# Patient Record
Sex: Male | Born: 1970 | Hispanic: No | Marital: Single | Smoking: Former smoker
Health system: Southern US, Community
[De-identification: ages and names within clinical notes are randomized; demographics above are authoritative.]

## PROBLEM LIST (undated history)

## (undated) DIAGNOSIS — F845 Asperger's syndrome: Secondary | ICD-10-CM

## (undated) DIAGNOSIS — F1010 Alcohol abuse, uncomplicated: Secondary | ICD-10-CM

## (undated) DIAGNOSIS — F419 Anxiety disorder, unspecified: Secondary | ICD-10-CM

## (undated) HISTORY — DX: Alcohol abuse, uncomplicated: F10.10

---

## 1998-01-15 ENCOUNTER — Encounter: Admission: RE | Admit: 1998-01-15 | Discharge: 1998-01-15 | Payer: Self-pay | Admitting: Family Medicine

## 1998-02-10 ENCOUNTER — Encounter: Admission: RE | Admit: 1998-02-10 | Discharge: 1998-02-10 | Payer: Self-pay | Admitting: Family Medicine

## 1999-04-14 ENCOUNTER — Encounter: Admission: RE | Admit: 1999-04-14 | Discharge: 1999-04-14 | Payer: Self-pay | Admitting: Family Medicine

## 2002-05-25 ENCOUNTER — Emergency Department (HOSPITAL_COMMUNITY): Admission: EM | Admit: 2002-05-25 | Discharge: 2002-05-26 | Payer: Self-pay | Admitting: Emergency Medicine

## 2002-05-31 ENCOUNTER — Emergency Department (HOSPITAL_COMMUNITY): Admission: EM | Admit: 2002-05-31 | Discharge: 2002-05-31 | Payer: Self-pay | Admitting: Emergency Medicine

## 2013-02-16 ENCOUNTER — Encounter (HOSPITAL_COMMUNITY): Payer: Self-pay | Admitting: Emergency Medicine

## 2013-02-16 ENCOUNTER — Emergency Department (HOSPITAL_COMMUNITY)
Admission: EM | Admit: 2013-02-16 | Discharge: 2013-02-17 | Disposition: A | Discharge status: Other Acute Inpatient Hospital | Payer: BC Managed Care – PPO | Attending: Emergency Medicine | Admitting: Emergency Medicine

## 2013-02-16 DIAGNOSIS — F1410 Cocaine abuse, uncomplicated: Secondary | ICD-10-CM | POA: Insufficient documentation

## 2013-02-16 DIAGNOSIS — Z87891 Personal history of nicotine dependence: Secondary | ICD-10-CM | POA: Insufficient documentation

## 2013-02-16 DIAGNOSIS — Z79899 Other long term (current) drug therapy: Secondary | ICD-10-CM | POA: Insufficient documentation

## 2013-02-16 DIAGNOSIS — Z8659 Personal history of other mental and behavioral disorders: Secondary | ICD-10-CM | POA: Insufficient documentation

## 2013-02-16 DIAGNOSIS — F1010 Alcohol abuse, uncomplicated: Secondary | ICD-10-CM | POA: Insufficient documentation

## 2013-02-16 HISTORY — DX: Asperger's syndrome: F84.5

## 2013-02-16 LAB — CBC
HCT: 45.3 % (ref 39.0–52.0)
MCV: 96.6 fL (ref 78.0–100.0)
Platelets: 233 10*3/uL (ref 150–400)
RBC: 4.69 MIL/uL (ref 4.22–5.81)
WBC: 7.6 10*3/uL (ref 4.0–10.5)

## 2013-02-16 LAB — RAPID URINE DRUG SCREEN, HOSP PERFORMED
Amphetamines: NOT DETECTED
Barbiturates: NOT DETECTED
Tetrahydrocannabinol: NOT DETECTED

## 2013-02-16 LAB — COMPREHENSIVE METABOLIC PANEL
ALT: 48 U/L (ref 0–53)
AST: 50 U/L — ABNORMAL HIGH (ref 0–37)
Alkaline Phosphatase: 64 U/L (ref 39–117)
BUN: 11 mg/dL (ref 6–23)
CO2: 21 mEq/L (ref 19–32)
Chloride: 101 mEq/L (ref 96–112)
Creatinine, Ser: 0.79 mg/dL (ref 0.50–1.35)
GFR calc Af Amer: >90 mL/min (ref >90)
GFR calc non Af Amer: >90 mL/min (ref >90)
Total Bilirubin: 0.4 mg/dL (ref 0.3–1.2)

## 2013-02-16 LAB — SALICYLATE LEVEL: Salicylate Lvl: <2 mg/dL — ABNORMAL LOW (ref 2.8–20.0)

## 2013-02-16 MED ORDER — ZOLPIDEM TARTRATE 10 MG PO TABS: 10.0000 mg | ORAL_TABLET | Freq: Every evening | ORAL | Status: DC | PRN

## 2013-02-16 MED ORDER — THIAMINE HCL 100 MG/ML IJ SOLN: 100.0000 mg | Freq: Every day | INTRAMUSCULAR | Status: DC

## 2013-02-16 MED ORDER — VITAMIN B-1 100 MG PO TABS
100.0000 mg | ORAL_TABLET | Freq: Every day | ORAL | Status: DC
  Administered 2013-02-16 – 2013-02-17 (×2): 100 mg via ORAL
  Filled 2013-02-16 (×2): qty 1

## 2013-02-16 MED ORDER — NICOTINE 21 MG/24HR TD PT24
21.0000 mg | MEDICATED_PATCH | Freq: Once | TRANSDERMAL | Status: AC
  Administered 2013-02-16: 21 mg via TRANSDERMAL
  Filled 2013-02-16: qty 1

## 2013-02-16 MED ORDER — ALUM & MAG HYDROXIDE-SIMETH 200-200-20 MG/5ML PO SUSP: 30.0000 mL | ORAL | Status: DC | PRN

## 2013-02-16 MED ORDER — IBUPROFEN 200 MG PO TABS: 600.0000 mg | ORAL_TABLET | Freq: Three times a day (TID) | ORAL | Status: DC | PRN

## 2013-02-16 MED ORDER — DOUBLE ANTIBIOTIC 500-10000 UNIT/GM EX OINT
TOPICAL_OINTMENT | Freq: Two times a day (BID) | CUTANEOUS | Status: DC
  Administered 2013-02-16 (×2): 1 via TOPICAL
  Administered 2013-02-17: 10:00:00 via TOPICAL
  Filled 2013-02-16: qty 1

## 2013-02-16 MED ORDER — ACETAMINOPHEN 325 MG PO TABS: 650.0000 mg | ORAL_TABLET | ORAL | Status: DC | PRN

## 2013-02-16 MED ORDER — LORAZEPAM 1 MG PO TABS: 0.0000 mg | ORAL_TABLET | Freq: Two times a day (BID) | ORAL | Status: DC

## 2013-02-16 MED ORDER — DOUBLE ANTIBIOTIC 500-10000 UNIT/GM EX OINT
TOPICAL_OINTMENT | Freq: Two times a day (BID) | CUTANEOUS | Status: DC
  Filled 2013-02-16 (×18): qty 1

## 2013-02-16 MED ORDER — ONDANSETRON HCL 4 MG PO TABS: 4.0000 mg | ORAL_TABLET | Freq: Three times a day (TID) | ORAL | Status: DC | PRN

## 2013-02-16 MED ORDER — LORAZEPAM 1 MG PO TABS
0.0000 mg | ORAL_TABLET | Freq: Four times a day (QID) | ORAL | Status: DC
  Administered 2013-02-16: 1 mg via ORAL
  Filled 2013-02-16: qty 1

## 2013-02-16 NOTE — ED Provider Notes (Signed)
CSN: [id number]     Arrival date & time 02/16/13  4098 History   First MD Initiated Contact with Patient 02/16/13 [phone]     Chief Complaint  Patient presents with  . Medical Clearance   (Consider location/radiation/quality/duration/timing/severity/associated sxs/prior Treatment) HPI Patient presents to the emergency department for detox. His care was delayed because it took him a while to decide if he was wanting to sign the consent for treatment form and then to get undressed. He reports he wants detox from alcohol and cocaine. Patient reports he has Asperger's syndrome. He reports he has been seeing a psychiatrist for many years and has been on Xanax 6 mg a day (2 mg 3 times a day) for many years, over 20. He states he has been on Wellbutrin and Prozac in the past but they did not help. He states he stopped seeing his psychiatrist about 6 or 7 months ago and now has a counselor that he sees 7 days a week for one hour to help him get through his issues. Patient reports he has been having some personal problems mainly including some embezzelment in his company. Reports he has not been sleeping trying to gather evidence of the embezzlement and indicates something happened and he cannot prosecute the person involved, although he knows who did it and has proof.  He also reports he has custody of his 35 year old daughter, who is autistic, after his wife accused him of possibly molesting her. He also states his ex-wife is an addict, also. He reports he has been taping himself and writing in [employer] and "now sees what other people were seeing about me". He does not elucidate what that means. He states he drinks most days, he does admit to a pint of tequila a day. He also states he has been doing cocaine for the past 1-1/2 years off and on but it has gotten worse in the past 6 months when his psychiatrist was trying to change him off his Xanax. He reports they tried other medications, however he wanted to go  back to the Xanax however his psychiatrist would not putting back on the prior doses he been on in the past. He states he has been off the Xanax for at least 3-4 weeks. Patient quickly denies that he is depressed. He denies suicidal or homicidal ideation. He states he's never been to detox or rehabilitation in the past.  Patient noted to have a scaly area in the palm of his left hand with some cracking. He states that is from handling a chemical called MEK. Patient denies any acute physical problem such as sore throat, rhinorrhea, cough, vomiting or diarrhea.   PCP none Psychiatrist was Dr Donell Beers until 6-7 months ago  Past Medical History  Diagnosis Date  . Asperger's disorder    History reviewed. No pertinent past surgical history. No family history on file. History  Substance Use Topics  . Smoking status: Former Smoker    Types: Cigarettes  . Smokeless tobacco: Former Neurosurgeon    Types: Chew  . 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    Allergies  Review of patient's allergies indicates no known allergies.  Home Medications   Current Outpatient Rx  Name  Route  Sig  Dispense  Refill  . ALPRAZolam (XANAX) 1 MG tablet   Oral   Take 1-2 mg by mouth 3 (three) times daily as needed for anxiety.           Pt ran out 3-4 weeks ago   BP 155/97  Pulse 98  Temp(Src) 97.9 F (36.6 C) (Oral)  Resp 14  Ht 5\' 6"  (1.676 m)  Wt 145 lb (65.772 kg)  BMI 23.41 kg/m2  SpO2 99%  Vital signs normal   Physical Exam  Nursing note and vitals reviewed. Constitutional: He is oriented to person, place, and time. He appears well-developed and well-nourished.  Non-toxic appearance. He does not appear ill. No distress.  HENT:  Head: Normocephalic and  atraumatic.  Right Ear: External ear normal.  Left Ear: External ear normal.  Nose: Nose normal. No mucosal edema or rhinorrhea.  Mouth/Throat: Oropharynx is clear and moist and mucous membranes are normal. No dental abscesses or uvula swelling.  Eyes: Conjunctivae and EOM are normal. Pupils are equal, round, and reactive to light.  Neck: Normal range of motion and full passive range of motion without pain. Neck supple.  Cardiovascular: Normal rate, regular rhythm and normal heart sounds.  Exam reveals no gallop and no friction rub.   No murmur heard. Pulmonary/Chest: Effort normal and breath sounds normal. No respiratory distress. He has no wheezes. He has no rhonchi. He has no rales. He exhibits no tenderness and no crepitus.  Abdominal: Soft. Normal appearance and bowel sounds are normal. He exhibits no distension. There is no tenderness. There is no rebound and no guarding.  Musculoskeletal: Normal range of motion. He exhibits no edema and no tenderness.  Moves all extremities well.   Neurological: He is alert and oriented to person, place, and time. He has normal strength. No cranial nerve deficit.  Skin: Skin is warm, dry and intact. No rash noted. No erythema. No pallor.  Psychiatric: His speech is normal. His mood appears not anxious.  Pt hard to direct in questioning. Does not answer simple yes or no questions, talks about other things.    ED Course  Procedures (including critical care time)  Patient asking me at what point he could leave. He was advised that he could leave at any time however if he returned he would have to start the process all over. Pt states he has claustrophobia and it bothers him if he thinks he can't leave.   11:01 [facility] called to get information about referral  Pt placed in a hallway bed so he wouldn't have claustrophobia.   Labs Review Results for orders placed during the hospital encounter of 02/16/13  ACETAMINOPHEN LEVEL      Result Value Range    Acetaminophen (Tylenol), Serum <15.0  10 - 30 ug/mL  CBC      Result Value Range   WBC 7.6  4.0 - 10.5 K/uL   RBC 4.69  4.22 - 5.81 MIL/uL   Hemoglobin 16.2  13.0 - 17.0 g/dL   HCT 45.4  09.8 - 11.9 %   MCV 96.6  78.0 - 100.0 fL   MCH 34.5 (*) 26.0 - 34.0 pg   MCHC 35.8  30.0 - 36.0 g/dL   RDW 14.7  82.9 - 56.2 %   Platelets 233  150 - 400 K/uL  COMPREHENSIVE METABOLIC PANEL      Result Value Range   Sodium 137  135 - 145 mEq/L   Potassium 3.4 (*) 3.5 -  5.1 mEq/L   Chloride 101  96 - 112 mEq/L   CO2 21  19 - 32 mEq/L   Glucose, Bld 98  70 - 99 mg/dL   BUN 11  6 - 23 mg/dL   Creatinine, Ser 3.08  0.50 - 1.35 mg/dL   Calcium 9.3  8.4 - 65.7 mg/dL   Total Protein 7.9  6.0 - 8.3 g/dL   Albumin 4.4  3.5 - 5.2 g/dL   AST 50 (*) 0 - 37 U/L   ALT 48  0 - 53 U/L   Alkaline Phosphatase 64  39 - 117 U/L   Total Bilirubin 0.4  0.3 - 1.2 mg/dL   GFR calc non Af Amer >90  >90 mL/min   GFR calc Af Amer >90  >90 mL/min  ETHANOL      Result Value Range   Alcohol, Ethyl (B) 237 (*) 0 - 11 mg/dL  SALICYLATE LEVEL      Result Value Range   Salicylate Lvl <2.0 (*) 2.8 - 20.0 mg/dL   Laboratory interpretation all normal except mild hypokalemia, alcohol intoxication    Imaging Review No results found.  EKG Interpretation   None       MDM   1. Alcohol abuse   2. Cocaine abuse     Disposition pending   Devoria Albe, MD, Armando Gang    Ward Givens, MD 02/16/13 [phone]

## 2013-02-16 NOTE — ED Notes (Signed)
Pt talking on the phone with his wife.

## 2013-02-16 NOTE — ED Notes (Signed)
PER telephone conversation with pt EX wife, ex wife is concerned about pt's alcohol and drug abuse.  Reports that pt drinks 4 pints of Brandy daily and is 'smoking cocaine'.  Ex wife is concerned that pt will try to leave.  Ex wife also recommended that pt be treated with librium and reports that pt is not abusing xanax.  Xanax is needed for treatment.  Friends and family are concerned that pt may entering early stages of dementia.  Ex wife contact information [phone]).

## 2013-02-16 NOTE — ED Notes (Signed)
RN Tora Duck, began calling report to writer on this pt. She said that pt had red, dryness and sores on his arms and that's why bacitracin was ordered for him. There's also notes about putting pt bed in the hall because he was clausterphobic in the room and that the ex wife thinks he's got early stages of dementia. Asked lillibeth if writer could call her back after speaking to [facility]. Called [facility] and voiced all concerns and she looked over information and said we could take the pt. Charge RN, Scientist, research (medical) called for Clinical research associate while she was on the phone with the [facility] and she's the one that answered when writer tried to call lillibeth back to say pt could come back to room 43. She asked what concerns did Clinical research associate have about pt and Clinical research associate asked had she seen the pt herself at all and the concern was the statement that kind of sounded like the pt had bedbugs by the description of red, dry, scaly areas with sores. When Lillibeth called back the second time she said his arms are just red. Miss Victorino Dike said she would peek in on pt and writer told her pt could go to room 43. Writer thanked her also.

## 2013-02-16 NOTE — ED Notes (Signed)
Pt having tele-psych consult at this time.

## 2013-02-16 NOTE — ED Notes (Signed)
Pt states can not void at this time 

## 2013-02-16 NOTE — BH Assessment (Signed)
Assessment Note  Nathan Frey is an 42 y.o. male dx's with Asperger's. Patient presents to the emergency department for detox. He reports he wants detox from alcohol and cocaine. Patient started drinking at age 69. He does admit to drinking a pint of liqour a day for the past 5-6 yrs. His last drink was 0730 today.  He also states he tried cocaine at age 20. His cocaine usage has grown more consistent over the past 5-6 yrs and worsened in the past yr. Patient reports spending $1000 per week on cocaine in the last 1- 1/2 year.  He also states that over the past 1-1/2 years his cocaine usage increased b/c his psychiatrist-[name] was trying to take him off his Xanax. Patient reports no withdrawal symptoms. He has no history of black outs or seizures.  He reports that he has been seeing a psychiatrist-[name] for many years and has been presceibed Xanax 6 mg a day (2 mg 3 times a day) for many years (over 20 + yrs). He reports they tried other medications such as Wellbutrin and Prozac. Sts they did not help and he wanted to go back to the Xanax. His psychiatrist would not put him back on the prior doses he been on in the past. He states he has been off the Xanax for at least 3-4 weeks. He states he stopped seeing his psychiatrist several months ago and now has a counselor that he sees 7 days a week for one hour to help him get through his issues.  Patient denies SI, HI, and AVH's. He no associated history of symptoms. He does report feelings of increased anxiety and depression. He feels hopeless and is very tearful during the assessment. 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 His business is suffering b/c of someone within the company . Reports he has not been sleeping trying to gather evidence of the embezzlement and indicates something happened and he cannot prosecute the person involved, although he knows who did it  and has proof. He also reports he has custody of his 8 year old daughter, who is autistic, after his wife accused him of possibly molesting her.   Patient has no history of inpatient/outpatient mental health treatment. He also has no history of substance abuse treatment.  Patient is pending a evaluation by an extender at [facility]. Writer has contacted [facility] and although they do not have any beds available will take a faxed referral, per [name]  [phone] if psychiatry clears patient for detox treatment. [name] sts they are willing to review and possible place on their waitlist but must also see the psych consult.    Axis I: Alcohol Dependence; Cocaine Abuse; Depressive Disorder NOS, and Anxiety Disorder NOS Axis II: Deferred Axis III:  Past Medical History  Diagnosis Date  . Asperger's disorder    Axis IV: other psychosocial or environmental problems, problems related to social environment, problems with access to health care services and problems with primary support group Axis V: 41-50 serious symptoms  Past Medical History:  Past Medical History  Diagnosis Date  . Asperger's disorder     History reviewed. No pertinent past surgical history.  Family History: No family history on file.  Social History:  reports that he has quit smoking. His smoking use included Cigarettes. He smoked 0.00 packs per day. He has quit using smokeless tobacco. His smokeless tobacco use included Chew. He reports that he  drinks alcohol. He reports that he uses illicit drugs (Cocaine).  Additional Social History:  Alcohol / Drug Use Pain Medications: SEE MAR Prescriptions: SEE MAR Over the Counter: SEE MAR History of alcohol / drug use?: Yes Substance #1 Name of Substance 1: Alcohol  1 - Age of First Use: 15 yrs ago  1 - Amount (size/oz): "I drink all day"; "Sometimes I drink up to 1 pint" 1 - Frequency: daily for the past 4-5 yrs  1 - Duration: on-going  1 - Last Use / Amount: today   Substance #2 Name of Substance 2: Cocaine  2 - Age of First Use: 13 yrs ago 2 - Amount (size/oz): $1000 per week 2 - Frequency: daily  2 - Duration: on-going  2 - Last Use / Amount: today   CIWA: CIWA-Ar BP: 136/82 mmHg Pulse Rate: 95 Nausea and Vomiting: no nausea and no vomiting Tactile Disturbances: none Tremor: no tremor Auditory Disturbances: not present Paroxysmal Sweats: no sweat visible Visual Disturbances: not present Anxiety: five Headache, Fullness in Head: none present Agitation: moderately fidgety and restless Orientation and Clouding of Sensorium: oriented and can do serial additions CIWA-Ar Total: 9 COWS:    Allergies: No Known Allergies  Home Medications:  (Not in a hospital admission)  OB/GYN Status:  No LMP for male patient.  General Assessment Data Location of Assessment: [facility] Is this a Tele or Face-to-Face Assessment?: Tele Assessment Living Arrangements: Other (Comment) (lives in home with ex-spouse and 89 y/o daughter) Can pt return to current living arrangement?: Yes Admission Status: Voluntary Is patient capable of signing voluntary admission?: Yes Transfer from: Acute Hospital Referral Source: Self/Family/Friend     [facility] Crisis Care Plan Living Arrangements: Other (Comment) (lives in home with ex-spouse and 14 y/o daughter) Name of Psychiatrist:  (Dr. [name]) Name of Therapist:  (None Reported)  Education Status Is patient currently in school?: No  Risk to self Suicidal Ideation: No Suicidal Intent: No Is patient at risk for suicide?: No Suicidal Plan?: No Access to Means: No What has been your use of drugs/alcohol within the last 12 months?:  (patient reports alcohol and drug use) Previous Attempts/Gestures: No How many times?:  (0) Other Self Harm Risks:  (n/a) Triggers for Past Attempts:  (no previous attempts/gestures) Intentional Self Injurious Behavior: None Family Suicide History: No Recent stressful life event(s):  Other (Comment) ( custody of his 65 y/o daughter,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) Persecutory voices/beliefs?: No Depression: Yes Depression Symptoms: Tearfulness;Feeling angry/irritable Substance abuse history and/or treatment for substance abuse?: No Suicide prevention information given to non-admitted patients: Not applicable  Risk to Others Homicidal Ideation: No Thoughts of Harm to Others: No Current Homicidal Intent: No Current Homicidal Plan: No Access to Homicidal Means: No Identified Victim:  (n/a) History of harm to others?: No Assessment of Violence: None Noted Violent Behavior Description:  (patient is calm and cooperative ) Does patient have access to weapons?: No Criminal Charges Pending?: No Does patient have a court date: No  Psychosis Hallucinations: None noted Delusions: None noted  Mental Status Report Appear/Hygiene: Disheveled Eye Contact: Fair Motor Activity: Freedom of movement Speech: Logical/coherent Level of Consciousness: Alert Mood: Depressed Affect: Appropriate to circumstance Anxiety Level: None Thought Processes: Relevant;Circumstantial;Flight of Ideas Judgement: Impaired Orientation: Person;Place;Time;Situation Obsessive Compulsive Thoughts/Behaviors: None  Cognitive Functioning Memory: Recent Intact;Remote Intact IQ: Average Insight: Fair Impulse Control: Fair Appetite: Fair Weight Loss:  (none reported) Weight Gain:  (none reported) Sleep: Decreased Total Hours of Sleep:  ("I haven't slept in  days") Vegetative Symptoms: None  ADLScreening [facility] Assessment Services) Patient's cognitive ability adequate to safely complete daily activities?: Yes Patient able to express need for assistance with ADLs?: Yes Independently performs ADLs?: Yes (appropriate for developmental age)  Prior Inpatient Therapy Prior Inpatient Therapy: No Prior Therapy Dates:  (n/a) Prior Therapy Facilty/Provider(s):  (n/a) Reason for Treatment:  (n/a)  Prior  Outpatient Therapy Prior Outpatient Therapy: Yes Prior Therapy Dates:  (past-Dr.Plotvosky; past-Lisa McClain in [place]) Prior Therapy Facilty/Provider(s):  (Dr. Charlette Caffey and Jerl Santos) Reason for Treatment:  (n/a)  ADL Screening (condition at time of admission) Patient's cognitive ability adequate to safely complete daily activities?: Yes Is the patient deaf or have difficulty hearing?: No Does the patient have difficulty seeing, even when wearing glasses/contacts?: No Does the patient have difficulty concentrating, remembering, or making decisions?: No Patient able to express need for assistance with ADLs?: Yes Does the patient have difficulty dressing or bathing?: No Independently performs ADLs?: Yes (appropriate for developmental age) Communication: Independent Dressing (OT): Independent Grooming: Independent Feeding: Independent Bathing: Independent Toileting: Independent In/Out Bed: Independent Walks in Home: Independent Does the patient have difficulty walking or climbing stairs?: No Weakness of Legs: None Weakness of Arms/Hands: None  Home Assistive Devices/Equipment Home Assistive Devices/Equipment: None    Abuse/Neglect Assessment (Assessment to be complete while patient is alone) Physical Abuse: Denies Verbal Abuse: Denies Sexual Abuse: Denies Exploitation of patient/patient's resources: Denies Self-Neglect: Denies Values / Beliefs Cultural Requests During Hospitalization: None Spiritual Requests During Hospitalization: None   Advance Directives (For Healthcare) Advance Directive: Patient does not have advance directive Nutrition Screen- [facility] Adult/[facility] Patient's home diet: Regular  Additional Information 1:1 In Past 12 Months?: No CIRT Risk: No Elopement Risk: No Does patient have medical clearance?: Yes     Disposition:  Disposition Initial Assessment Completed for this Encounter: Yes Disposition of Patient: Other dispositions (Pending  evaluation by an extender for recommendations) Other disposition(s): Other (Comment) (pt requesting detox; further dispo. pending eval by extender)  On Site Evaluation by:   Reviewed with Physician:    Melynda Ripple [facility] 02/16/2013 1:40 PM

## 2013-02-16 NOTE — ED Notes (Signed)
Pt awaiting bed in secure holding area.

## 2013-02-16 NOTE — ED Notes (Signed)
Informed pt that we need urine specimen. Pt states he is unable to go at this time. Gave pt cup ice water.

## 2013-02-16 NOTE — ED Notes (Signed)
Pt has 1 personal belongings bag 1 blue duffle bag and 1 black back pack brown shoes socks jeans belt shirt 1 key ring red folder black folder 1 pair jeans 5 shirts boxers shampoo razor shaving cream toothpaste

## 2013-02-16 NOTE — ED Notes (Signed)
Belongings placed in locker 27 

## 2013-02-16 NOTE — ED Notes (Signed)
Pt been taking xanax 6mg  everyday for 20 years and been out for couple weeks now. Pt been abusing alcohol and cocaine.

## 2013-02-16 NOTE — ED Notes (Signed)
Pt requesting detox from alcohol and cocaine. Pt last drink was about 15 mins ago.

## 2013-02-16 NOTE — ED Notes (Signed)
Personal belongings moved to [address].

## 2013-02-16 NOTE — BH Assessment (Signed)
Writer contacted EDP-Dr. Lynelle Doctor, [place] prior to assessing this patient. TA scheduled for 11am. Writer contacted [facility] staff-Asja and asked that the telepsych machine is placed in the room.

## 2013-02-16 NOTE — Consult Note (Signed)
[facility] Face-to-Face Psychiatry Consult   Reason for Consult:  Alcohol dependence, Cocaine dependence Referring Physician:  EDP Nathan Frey is an 42 y.o. male.  Assessment: AXIS I:  Anxiety Disorder NOS, Depressive Disorder NOS and Alcohol dependenc AXIS II:  Deferred AXIS III:   Past Medical History  Diagnosis Date  . Asperger's disorder    AXIS IV:  other psychosocial or environmental problems and problems related to social environment AXIS V:  41-50 serious symptoms  Plan:  Recommend psychiatric Inpatient admission when medically cleared.  Subjective:   Nathan Frey is a 42 y.o. male patient admitted with Alcohol dependence, Cocaine dependence.  HPI:  Male, 33 with  dx's with Asperger's. Patient presents to the emergency department for detox. He reports he wants detox from alcohol and cocaine. Patient started drinking at age 8. He does admit to drinking a pint of liqour a day for the past 5-6 yrs. His last drink was 0730 today. He also states he tried cocaine at age 71. His cocaine usage has grown more consistent over the past 5-6 yrs and worsened in the past yr. Patient reports spending $1000 per week on cocaine in the last 1- 1/2 year. He also states that over the past 1-1/2 years his cocaine usage increased b/c his psychiatrist-[name] was trying to take him off his Xanax. Patient reports no withdrawal symptoms. He has no history of black outs or seizures.  Patient states he uses alcohol and Cocaine to " fight sleep, I do not want to sleep"  Patient agrees to coming to the inpatient Psychiatric unit for detox from alcohol and cocaine but also want [place] to put him back on Xanax.  When informed by this writer that it is not possible due to the danger of alcohol and Xanax he decided to hold off detox treatment at this time.  He denies SI/HI/AVH.  He states he will inform staff if he will be ready for detox without receiving his xanax while in the unit.  Collateral Information from  Nathan Frey:  Nathan Frey called th ER nurse and stated that patient drinks 4 pints of brandy daily and uses large amount of cocaine daily.  She also requested  that we do not allow patient to go home no matter how hard he tries.  Nathan Frey also want patient to be prescribed Xanax while he is in the detox unit.  We will continue to detox patient in our ER [facility] and monitor him overnight.  HPI Elements:   Location:  [facility]. Quality:  MODERATE-SEVERE.  Past Psychiatric History: Past Medical History  Diagnosis Date  . Asperger's disorder     reports that he has quit smoking. His smoking use included Cigarettes. He smoked 0.00 packs per day. He has quit using smokeless tobacco. His smokeless tobacco use included Chew. He reports that he drinks alcohol. He reports that he uses illicit drugs (Cocaine). No family history on file. Family History Substance Abuse: No Family Supports: Yes, List: Living Arrangements: Other (Comment) (lives in home with ex-spouse and 49 y/o daughter) Can pt return to current living arrangement?: Yes Abuse/Neglect [facility]) Physical Abuse: Denies Verbal Abuse: Denies Sexual Abuse: Denies Allergies:  No Known Allergies  ACT Assessment Complete:  Yes:    Educational Status    Risk to Self: Risk to self Suicidal Ideation: No Suicidal Intent: No Is patient at risk for suicide?: No Suicidal Plan?: No Access to Means: No What has been your use of drugs/alcohol within the last 12 months?:  (  patient reports alcohol and drug use) Previous Attempts/Gestures: No How many times?:  (0) Other Self Harm Risks:  (n/a) Triggers for Past Attempts:  (no previous attempts/gestures) Intentional Self Injurious Behavior: None Family Suicide History: No Recent stressful life event(s): Other (Comment) ( custody of his 57 y/o daughter,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) Persecutory voices/beliefs?: No Depression: Yes Depression Symptoms: Tearfulness;Feeling angry/irritable Substance abuse history and/or  treatment for substance abuse?: No Suicide prevention information given to non-admitted patients: Not applicable  Risk to Others: Risk to Others Homicidal Ideation: No Thoughts of Harm to Others: No Current Homicidal Intent: No Current Homicidal Plan: No Access to Homicidal Means: No Identified Victim:  (n/a) History of harm to others?: No Assessment of Violence: None Noted Violent Behavior Description:  (patient is calm and cooperative ) Does patient have access to weapons?: No Criminal Charges Pending?: No Does patient have a court date: No  Abuse: Abuse/Neglect Assessment (Assessment to be complete while patient is alone) Physical Abuse: Denies Verbal Abuse: Denies Sexual Abuse: Denies Exploitation of patient/patient's resources: Denies Self-Neglect: Denies  Prior Inpatient Therapy: Prior Inpatient Therapy Prior Inpatient Therapy: No Prior Therapy Dates:  (n/a) Prior Therapy Facilty/Provider(s):  (n/a) Reason for Treatment:  (n/a)  Prior Outpatient Therapy: Prior Outpatient Therapy Prior Outpatient Therapy: Yes Prior Therapy Dates:  (past-Dr.Plotvosky; past-Lisa McClain in [place]) Prior Therapy Facilty/Provider(s):  (Dr. Charlette Caffey and Jerl Santos) Reason for Treatment:  (n/a)  Additional Information: Additional Information 1:1 In Past 12 Months?: No CIRT Risk: No Elopement Risk: No Does patient have medical clearance?: Yes                  Objective: Blood pressure 140/86, pulse 101, temperature 97.9 F (36.6 C), temperature source Oral, resp. rate 16, height 5\' 6"  (1.676 m), weight 65.772 kg (145 lb), SpO2 99.00%.Body mass index is 23.41 kg/(m^2). Results for orders placed during the hospital encounter of 02/16/13 (from the past 72 hour(s))  ACETAMINOPHEN LEVEL     Status: None   Collection Time    02/16/13  9:45 AM      Result Value Range   Acetaminophen (Tylenol), Serum <15.0  10 - 30 ug/mL   Comment:            THERAPEUTIC CONCENTRATIONS VARY      SIGNIFICANTLY. A RANGE OF 10-30     ug/mL MAY BE AN EFFECTIVE     CONCENTRATION FOR MANY PATIENTS.     HOWEVER, SOME ARE BEST TREATED     AT CONCENTRATIONS OUTSIDE THIS     RANGE.     ACETAMINOPHEN CONCENTRATIONS     >150 ug/mL AT 4 HOURS AFTER     INGESTION AND >50 ug/mL AT 12     HOURS AFTER INGESTION ARE     OFTEN ASSOCIATED WITH TOXIC     REACTIONS.  CBC     Status: Abnormal   Collection Time    02/16/13  9:45 AM      Result Value Range   WBC 7.6  4.0 - 10.5 K/uL   RBC 4.69  4.22 - 5.81 MIL/uL   Hemoglobin 16.2  13.0 - 17.0 g/dL   HCT 16.1  09.6 - 04.5 %   MCV 96.6  78.0 - 100.0 fL   MCH 34.5 (*) 26.0 - 34.0 pg   MCHC 35.8  30.0 - 36.0 g/dL   RDW 40.9  81.1 - 91.4 %   Platelets 233  150 - 400 K/uL  COMPREHENSIVE METABOLIC PANEL     Status:  Abnormal   Collection Time    02/16/13  9:45 AM      Result Value Range   Sodium 137  135 - 145 mEq/L   Potassium 3.4 (*) 3.5 - 5.1 mEq/L   Chloride 101  96 - 112 mEq/L   CO2 21  19 - 32 mEq/L   Glucose, Bld 98  70 - 99 mg/dL   BUN 11  6 - 23 mg/dL   Creatinine, Ser 9.62  0.50 - 1.35 mg/dL   Calcium 9.3  8.4 - 95.2 mg/dL   Total Protein 7.9  6.0 - 8.3 g/dL   Albumin 4.4  3.5 - 5.2 g/dL   AST 50 (*) 0 - 37 U/L   ALT 48  0 - 53 U/L   Alkaline Phosphatase 64  39 - 117 U/L   Total Bilirubin 0.4  0.3 - 1.2 mg/dL   GFR calc non Af Amer >90  >90 mL/min   GFR calc Af Amer >90  >90 mL/min   Comment: (NOTE)     The eGFR has been calculated using the CKD EPI equation.     This calculation has not been validated in all clinical situations.     eGFR's persistently <90 mL/min signify possible Chronic Kidney     Disease.  ETHANOL     Status: Abnormal   Collection Time    02/16/13  9:45 AM      Result Value Range   Alcohol, Ethyl (B) 237 (*) 0 - 11 mg/dL   Comment:            LOWEST DETECTABLE LIMIT FOR     SERUM ALCOHOL IS 11 mg/dL     FOR MEDICAL PURPOSES ONLY  SALICYLATE LEVEL     Status: Abnormal   Collection Time    02/16/13   9:45 AM      Result Value Range   Salicylate Lvl <2.0 (*) 2.8 - 20.0 mg/dL   Labs are reviewed and are pertinent for Alcohol 237, AST 50, Potassium3.4.  Current Facility-Administered Medications  Medication Dose Route Frequency Provider Last Rate Last Dose  . acetaminophen (TYLENOL) tablet 650 mg  650 mg Oral Q4H PRN Ward Givens, MD      . alum & mag hydroxide-simeth (MAALOX/MYLANTA) 200-200-20 MG/5ML suspension 30 mL  30 mL Oral PRN Ward Givens, MD      . ibuprofen (ADVIL,MOTRIN) tablet 600 mg  600 mg Oral Q8H PRN Ward Givens, MD      . LORazepam (ATIVAN) tablet 0-4 mg  0-4 mg Oral Q6H Ward Givens, MD   1 mg at 02/16/13 1213   Followed by  . [START ON 02/18/2013] LORazepam (ATIVAN) tablet 0-4 mg  0-4 mg Oral Q12H Iva L Knapp, MD      . nicotine (NICODERM CQ - dosed in mg/24 hours) patch 21 mg  21 mg Transdermal Once Ward Givens, MD   21 mg at 02/16/13 1002  . ondansetron (ZOFRAN) tablet 4 mg  4 mg Oral Q8H PRN Ward Givens, MD      . polymixin-bacitracin (POLYSPORIN) ointment   Topical BID Ward Givens, MD   1 application at 02/16/13 1138  . thiamine (VITAMIN B-1) tablet 100 mg  100 mg Oral Daily Ward Givens, MD   100 mg at 02/16/13 1213   Or  . thiamine (B-1) injection 100 mg  100 mg Intravenous Daily Ward Givens, MD      . zolpidem (AMBIEN) tablet  10 mg  10 mg Oral QHS PRN Ward Givens, MD       Current Outpatient Prescriptions  Medication Sig Dispense Refill  . ALPRAZolam (XANAX) 1 MG tablet Take 1-2 mg by mouth 3 (three) times daily as needed for anxiety.         Psychiatric Specialty Exam:     Blood pressure 140/86, pulse 101, temperature 97.9 F (36.6 C), temperature source Oral, resp. rate 16, height 5\' 6"  (1.676 m), weight 65.772 kg (145 lb), SpO2 99.00%.Body mass index is 23.41 kg/(m^2).  General Appearance: Casual  Eye Contact::  Good  Speech:  Slow  Volume:  Decreased  Mood:  Anxious, Depressed and Irritable  Affect:  Depressed and Flat  Thought Process:  Intact   Orientation:  Full (Time, Place, and Person)  Thought Content:  NA  Suicidal Thoughts:  No  Homicidal Thoughts:  No  Memory:  Immediate;   Good Recent;   Fair Remote;   Fair  Judgement:  Poor  Insight:  Lacking and Shallow  Psychomotor Activity:  Increased and Tremor  Concentration:  Poor  Recall:  NA  Akathisia:  NA  Handed:  Right  AIMS (if indicated):     Assets:  Desire for Improvement  Sleep:      Treatment Plan Summary:  Consult with Dr Tawni Carnes We will keep patient in our [facility] ER over night We will continue to detox him using our Ativan protocol Patient will be reevaluated by the next provider in am. Daily contact with patient to assess and evaluate symptoms and progress in treatment Medication management  Dahlia Byes, C  PMHNP-BC 02/16/2013 4:07 PM

## 2013-02-16 NOTE — ED Notes (Signed)
Pt resting with eyes closed at this time.

## 2013-02-17 ENCOUNTER — Inpatient Hospital Stay (HOSPITAL_COMMUNITY)
Admission: AD | Admit: 2013-02-17 | Discharge: 2013-02-20 | DRG: 885 | Disposition: A | Discharge status: Home / Self Care | Payer: BC Managed Care – PPO | Source: Intra-hospital | Attending: Psychiatry | Admitting: Psychiatry

## 2013-02-17 ENCOUNTER — Encounter (HOSPITAL_COMMUNITY): Payer: Self-pay | Admitting: *Deleted

## 2013-02-17 DIAGNOSIS — F411 Generalized anxiety disorder: Secondary | ICD-10-CM

## 2013-02-17 DIAGNOSIS — F1410 Cocaine abuse, uncomplicated: Secondary | ICD-10-CM | POA: Diagnosis present

## 2013-02-17 DIAGNOSIS — F329 Major depressive disorder, single episode, unspecified: Secondary | ICD-10-CM

## 2013-02-17 DIAGNOSIS — F848 Other pervasive developmental disorders: Secondary | ICD-10-CM | POA: Diagnosis present

## 2013-02-17 DIAGNOSIS — F332 Major depressive disorder, recurrent severe without psychotic features: Secondary | ICD-10-CM

## 2013-02-17 DIAGNOSIS — F1020 Alcohol dependence, uncomplicated: Secondary | ICD-10-CM

## 2013-02-17 HISTORY — DX: Anxiety disorder, unspecified: F41.9

## 2013-02-17 MED ORDER — IBUPROFEN 600 MG PO TABS: 600.0000 mg | ORAL_TABLET | Freq: Three times a day (TID) | ORAL | Status: DC | PRN

## 2013-02-17 MED ORDER — NICOTINE 14 MG/24HR TD PT24
14.0000 mg | MEDICATED_PATCH | Freq: Every day | TRANSDERMAL | Status: DC
  Administered 2013-02-17: 14 mg via TRANSDERMAL
  Filled 2013-02-17: qty 1

## 2013-02-17 MED ORDER — ALUM & MAG HYDROXIDE-SIMETH 200-200-20 MG/5ML PO SUSP: 30.0000 mL | ORAL | Status: DC | PRN

## 2013-02-17 MED ORDER — LORAZEPAM 1 MG PO TABS
0.0000 mg | ORAL_TABLET | Freq: Four times a day (QID) | ORAL | Status: AC
  Administered 2013-02-17 – 2013-02-18 (×2): 1 mg via ORAL
  Administered 2013-02-18: 2 mg via ORAL
  Administered 2013-02-18 – 2013-02-19 (×2): 1 mg via ORAL
  Filled 2013-02-17 (×2): qty 1
  Filled 2013-02-17: qty 2
  Filled 2013-02-17 (×2): qty 1

## 2013-02-17 MED ORDER — LORAZEPAM 1 MG PO TABS
0.0000 mg | ORAL_TABLET | Freq: Two times a day (BID) | ORAL | Status: DC
  Administered 2013-02-20: 1 mg via ORAL
  Filled 2013-02-17: qty 1

## 2013-02-17 MED ORDER — DOUBLE ANTIBIOTIC 500-10000 UNIT/GM EX OINT
TOPICAL_OINTMENT | Freq: Two times a day (BID) | CUTANEOUS | Status: DC
  Filled 2013-02-17 (×17): qty 1

## 2013-02-17 MED ORDER — ONDANSETRON HCL 4 MG PO TABS: 4.0000 mg | ORAL_TABLET | Freq: Three times a day (TID) | ORAL | Status: DC | PRN

## 2013-02-17 MED ORDER — ACETAMINOPHEN 325 MG PO TABS: 650.0000 mg | ORAL_TABLET | ORAL | Status: DC | PRN

## 2013-02-17 MED ORDER — VITAMIN B-1 100 MG PO TABS
100.0000 mg | ORAL_TABLET | Freq: Every day | ORAL | Status: DC
  Administered 2013-02-18 – 2013-02-20 (×3): 100 mg via ORAL
  Filled 2013-02-17 (×5): qty 1

## 2013-02-17 MED ORDER — AMANTADINE HCL 100 MG PO CAPS
100.0000 mg | ORAL_CAPSULE | Freq: Two times a day (BID) | ORAL | Status: DC
  Administered 2013-02-17: 100 mg via ORAL
  Filled 2013-02-17 (×2): qty 1

## 2013-02-17 MED ORDER — THIAMINE HCL 100 MG/ML IJ SOLN: 100.0000 mg | Freq: Every day | INTRAMUSCULAR | Status: DC

## 2013-02-17 MED ORDER — ZOLPIDEM TARTRATE 10 MG PO TABS
10.0000 mg | ORAL_TABLET | Freq: Every evening | ORAL | Status: DC | PRN
  Administered 2013-02-17 – 2013-02-19 (×3): 10 mg via ORAL
  Filled 2013-02-17 (×3): qty 1

## 2013-02-17 MED ORDER — NICOTINE 14 MG/24HR TD PT24
14.0000 mg | MEDICATED_PATCH | Freq: Every day | TRANSDERMAL | Status: DC
  Administered 2013-02-18 (×2): 14 mg via TRANSDERMAL
  Filled 2013-02-17 (×3): qty 1

## 2013-02-17 NOTE — ED Notes (Signed)
Report called to Lamount Cranker RN at [facility]. Accepted by Dr.Jonnalagadda to room 504-2. Requested to send at 1700 due to other patients ahead of him.

## 2013-02-17 NOTE — Progress Notes (Signed)
[name], male   DOB: [birth year], 42 y.o.   MRN: [id number] [name] Date of Evaluation:  02/17/2013   History of Present Illness:  Brought in by ex-wife for alcohol and cocaine dependence and Xanax abuse.  Lat Xanax was two weeks ago.  Patient states he is ready for detox and is willing to to let go his Xanax.  Yesterday he did not want to come in for admission with out his Xanax.   His personal Psychiatrist stopped his Xanax due to abuse.   He is more alert and oriented to day.  We will admit him to our inpatient unit as soon as possible.  He denies SI/HI/AVH.  Past Psychiatric History: Depression, Anxiety   Past Medical History:     Past Medical History  Diagnosis Date  . Asperger's disorder       History reviewed. No pertinent past surgical history.  Allergies: No Known Allergies  Current Medications:  Prior to Admission medications   Medication Sig Start Date End Date Taking? Authorizing Provider  ALPRAZolam Prudy Feeler) 1 MG tablet Take 1-2 mg by mouth 3 (three) times daily as needed for anxiety.  12/20/12  Yes Historical Provider, MD    Social History:    reports that he has quit smoking. His smoking use included Cigarettes. He smoked 0.00 packs per day. He has quit using smokeless tobacco. His smokeless tobacco use included Chew. He reports that he drinks alcohol. He reports that he uses illicit drugs (Cocaine).   Family History:    No family history on file.  Mental Status Examination/Evaluation:Psychiatric Specialty Exam: Physical Exam  ROS  Blood pressure 142/89, pulse 87, temperature 97.9 F (36.6 C), temperature source Oral, resp. rate 16, height 5\' 6"  (1.676 m), weight 65.772 kg (145 lb), SpO2 100.00%.Body mass index is 23.41 kg/(m^2).  General Appearance: Casual and Disheveled  Eye Contact::  Good  Speech:  Clear and Coherent and Normal Rate  Volume:  Normal  Mood:  Anxious, Depressed and Dysphoric  Affect:  Congruent,  Depressed, Flat and Restricted  Thought Process:  Coherent, Goal Directed and Intact  Orientation:  Full (Time, Place, and Person)  Thought Content:  NA  Suicidal Thoughts:  No  Homicidal Thoughts:  No  Memory:  Immediate;   Good Recent;   Good Remote;   Good  Judgement:  Poor  Insight:  Shallow  Psychomotor Activity:  Tremor  Concentration:  Fair  Recall:  NA  Akathisia:  NA  Handed:  Right  AIMS (if indicated):     Assets:  Desire for Improvement  Sleep:          DIAGNOSIS:   AXIS I   Alcohol dependence, Cocaine dependence  AXIS II  Deffered  AXIS III See medical notes.  AXIS IV other psychosocial or environmental problems, problems related to legal system/crime and problems related to social environment  AXIS V 41-50 serious symptoms     Assessment/Plan:  Seen with Dr Nathan Frey on rounds We will admit patient to our in patient Psychiatric unit for Detoxification treatment from alcohol  We will encourage patient to participate in group and individual therapy We will address his Depression when he is sober.  Nathan Frey   PMHNP-BC    Reviewed the information documented and agree with the treatment plan.  Nathan Frey,Nathan Frey. 02/17/2013 5:08 PM

## 2013-02-17 NOTE — Progress Notes (Signed)
[facility] Group Notes:  (Nursing/MHT/Case Management/Adjunct)  Date:  02/17/2013  Time:  8:00 p.m.   Type of Therapy:  Psychoeducational Skills  Participation Level:  Minimal  Participation Quality:  Inattentive  Affect:  Depressed  Cognitive:  Lacking  Insight:  None  Engagement in Group:  Limited  Modes of Intervention:  Education  Summary of Progress/Problems: The patient would only share in group that he slept during the daytime. He could not state a coping skill for the theme of the day.   Hazle Coca S 02/17/2013, 11:36 PM

## 2013-02-17 NOTE — Tx Team (Signed)
Initial Interdisciplinary Treatment Plan  PATIENT STRENGTHS: (choose at least two) Ability for insight Average or above average intelligence Capable of independent living General fund of knowledge  PATIENT STRESSORS: Substance abuse   PROBLEM LIST: Problem List/Patient Goals Date to be addressed Date deferred Reason deferred Estimated date of resolution  Substance Abuse 02/17/13                                                      DISCHARGE CRITERIA:  Ability to meet basic life and health needs Improved stabilization in mood, thinking, and/or behavior Withdrawal symptoms are absent or subacute and managed without 24-hour nursing intervention  PRELIMINARY DISCHARGE PLAN: Attend aftercare/continuing care group Return to previous living arrangement  PATIENT/FAMIILY INVOLVEMENT: This treatment plan has been presented to and reviewed with the patient, Nathan Frey, and/or family member, .  The patient and family have been given the opportunity to ask questions and make suggestions.  Elenore Wanninger, [place] 02/17/2013, 6:34 PM

## 2013-02-17 NOTE — Consult Note (Signed)
Pt accepted to 504-2 at [facility] and can come to [facility] now via [facility] [phone].    Julieanne Cotton NP accepted to Dr. Elsie Saas services  Nurse to call report [phone]  Send original paper work with pt to [facility]

## 2013-02-17 NOTE — ED Notes (Signed)
[facility] here to transport to [facility]. Belongings bag x3 given to driver. Ambulatory without difficulty. No complaints voiced. Escorted by mental health tech to [employer].

## 2013-02-17 NOTE — Consult Note (Signed)
Pt accepted to [facility] by Julieanne Cotton NP to [address] hall.  No beds at this time but TTS will let ED know when pt can be admitted.

## 2013-02-17 NOTE — Progress Notes (Signed)
42 year old male pt admitted for substance abuse. Pt states that he has been drinking, using cocaine and needs to get off of xanax. Pt reports that he was a pt of Dr. Donell Beers but that he longer sees him. Pt denies any depression and SI and states he has no thoughts of suicide and able to contract for safety on the unit. Pt states that he lives with his ex-wife and is in the process of reconciling with her. Pt denies that he was abusing xanax and states that he has been on it for the past 20 years. 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 Pt reports last drink was yesterday morning. Pt was oriented to the unit and safety maintained.

## 2013-02-17 NOTE — ED Notes (Signed)
Called [facility] to request transportation.

## 2013-02-18 DIAGNOSIS — F1994 Other psychoactive substance use, unspecified with psychoactive substance-induced mood disorder: Secondary | ICD-10-CM

## 2013-02-18 DIAGNOSIS — F1410 Cocaine abuse, uncomplicated: Secondary | ICD-10-CM

## 2013-02-18 DIAGNOSIS — F411 Generalized anxiety disorder: Secondary | ICD-10-CM | POA: Diagnosis present

## 2013-02-18 DIAGNOSIS — F1020 Alcohol dependence, uncomplicated: Secondary | ICD-10-CM

## 2013-02-18 DIAGNOSIS — F332 Major depressive disorder, recurrent severe without psychotic features: Secondary | ICD-10-CM | POA: Diagnosis present

## 2013-02-18 MED ORDER — NICOTINE 14 MG/24HR TD PT24
MEDICATED_PATCH | TRANSDERMAL | Status: AC
  Administered 2013-02-18: 09:00:00
  Filled 2013-02-18: qty 1

## 2013-02-18 MED ORDER — BACITRACIN-NEOMYCIN-POLYMYXIN 400-5-5000 EX OINT
TOPICAL_OINTMENT | Freq: Two times a day (BID) | CUTANEOUS | Status: DC
  Administered 2013-02-18 – 2013-02-20 (×5): 1 via TOPICAL
  Filled 2013-02-18 (×9): qty 1

## 2013-02-18 NOTE — Progress Notes (Signed)
Nathan Frey has had an " ok " day today.Marland KitchenMarland KitchenMarland KitchenHe remains sad, depressed and distant. He attends his groups as planned and he demonstrates engagement in his POC as evidenced by his compliance with his medications as well as his participation in his theapeis.   A He demonstrates both insight and knowledge regarding his disease.   R POC in place and sfaety maintaiend.

## 2013-02-18 NOTE — Progress Notes (Signed)
Psychoeducational Group Note  Date: 02/18/2013 Time:  1015  Group Topic/Focus:  Identifying Needs:   The focus of this group is to help patients identify their personal needs that have been historically problematic and identify healthy behaviors to address their needs.  Participation Level:  Active  Participation Quality:  Appropriate  Affect:  Flat  Cognitive:  Oriented  Insight:  Improving  Engagement in Group:  Engaged  Additional Comments:    Lynn Sissel A 
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
## 2013-02-18 NOTE — [facility] Suicide Risk Assessment (Signed)
Suicide Risk Assessment  Admission Assessment     Nursing information obtained from:    Demographic factors:    Current Mental Status:    Loss Factors:    Historical Factors:    Risk Reduction Factors:     CLINICAL FACTORS:   Severe Anxiety and/or Agitation Depression:   Anhedonia Comorbid alcohol abuse/dependence Impulsivity Recent sense of peace/wellbeing Severe Alcohol/Substance Abuse/Dependencies Unstable or Poor Therapeutic Relationship Previous Psychiatric Diagnoses and Treatments Medical Diagnoses and Treatments/Surgeries  COGNITIVE FEATURES THAT CONTRIBUTE TO RISK:  Closed-mindedness Loss of executive function Polarized thinking Thought constriction (tunnel vision)    SUICIDE RISK:   Moderate:  Frequent suicidal ideation with limited intensity, and duration, some specificity in terms of plans, no associated intent, good self-control, limited dysphoria/symptomatology, some risk factors present, and identifiable protective factors, including available and accessible social support.  PLAN OF CARE: The patient is needed for detox treatment for alcohol. Patient also disease crisis management, safety monitoring and medication management.  I certify that inpatient services furnished can reasonably be expected to improve the patient's condition.  Trebor Galdamez,JANARDHAHA R. 02/18/2013, 4:50 PM

## 2013-02-18 NOTE — Progress Notes (Signed)
D: Pt. Is calm and cooperative. Pt. Stated he just wants to get clean and stay clean. Pt. Stated he has gone days without sleeping due to stressors at work. Denies SI/HI/AVH. Denies pain, depression, hopelessness, and helplessness. Pt. Stated he wants to take as few medications as possible while here, in fear of becoming addicted to it. Not visibly displaying any withdrawel symptoms or complaining of any. A: q 15 min safety checks. scheduled and prn medications given R: pt. Remains safe on unit.

## 2013-02-18 NOTE — Progress Notes (Signed)
Patient reports having had a good day and has been getting some much needed sleep. Patient inquired about attending AA group tonight but changed his mind and stayed on 500 hall. Patient voiced no complaints, denies si/hi/a/v hallucinations. Encouraged patient to attend an AA meeting and he was receptive. Patient remains safe with 15 min checks.

## 2013-02-18 NOTE — Progress Notes (Signed)
Psychoeducational Group Note  Date:  02/18/2013 Time:  1015  Group Topic/Focus:  Making Healthy Choices:   The focus of this group is to help patients identify negative/unhealthy choices they were using prior to admission and identify positive/healthier coping strategies to replace them upon discharge.  Participation Level:  Active  Participation Quality:  Appropriate  Affect:  Appropriate  Cognitive:  Oriented  Insight:  Partisipating  Engagement in Group:  Engaged  Additional Comments:  Linden Tagliaferro A 02/18/2013  
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
## 2013-02-18 NOTE — [facility] Group Notes (Signed)
[facility] Group Notes:  (Clinical Social Work)  02/18/2013   3-4PM  Summary of Progress/Problems:   The main focus of today's process group was to identify the patient's current support system and decide on other supports that can be put in place.  The picture on workbook was used to discuss why additional supports are needed, and there was an interactive discussion about unhealthy supports and healthy supports.  The patient expressed full comprehension of the concepts presented, and agreed that there is a need to add more supports.  The patient stated the current supports in place are his wife, mother, daughter, entire family.  He just discontinued seeing his psychiatrist so anticipates finding a new one.  He also just "fired" his therapist and wants to replace that service as well.  In addition, he wants to go to AA and NA meetings again.  Type of Therapy:  Process Group  Participation Level:  Minimal  Participation Quality:  Attentive and Inattentive (in and out of the room)  Affect:  Blunted  Cognitive:  Oriented  Insight:  Limited  Engagement in Therapy:  Limited  Modes of Intervention:  Education,  Support and [employer], LCSW 02/18/2013, 4:00pm

## 2013-02-19 MED ORDER — NICOTINE 21 MG/24HR TD PT24
21.0000 mg | MEDICATED_PATCH | Freq: Every day | TRANSDERMAL | Status: DC
  Administered 2013-02-19 – 2013-02-20 (×2): 21 mg via TRANSDERMAL
  Filled 2013-02-19 (×4): qty 1

## 2013-02-19 NOTE — [facility] Suicide Risk Assessment (Signed)
[facility] INPATIENT:  Family/Significant Other Suicide Prevention Education  Suicide Prevention Education:  Education Completed; Nathan Frey, ex-wife; [phone]; has been identified by the patient as the family member/significant other with whom the patient will be residing, and identified as the person(s) who will aid the patient in the event of a mental health crisis (suicidal ideations/suicide attempt).  With written consent from the patient, the family member/significant other has been provided the following suicide prevention education, prior to the and/or following the discharge of the patient.  Writer spoke with ex-wife who confirmed patient not having SI prior to admission.  The suicide prevention education provided includes the following:  Suicide risk factors  Suicide prevention and interventions  National Suicide Hotline telephone number  [facility] assessment telephone number  [facility] Emergency Assistance 911  [facility] and/or Residential Mobile Crisis Unit telephone number  Request made of family/significant other to:  Remove weapons (e.g., guns, rifles, knives), all items previously/currently identified as safety concern.    Remove drugs/medications (over-the-counter, prescriptions, illicit drugs), all items previously/currently identified as a safety concern.  The family member/significant other verbalizes understanding of the suicide prevention education information provided.  The family member/significant other agrees to remove the items of safety concern listed above.  Wynn Banker 02/19/2013, 2:55 PM

## 2013-02-19 NOTE — [facility] Group Notes (Signed)
[facility] LCSW Aftercare Discharge Planning Group Note   02/19/2013 11:59 AM    Participation Quality:  Appropraite  Mood/Affect:  Appropriate  Depression Rating:  0  Anxiety Rating:  0  Thoughts of Suicide:  No  Will you contract for safety?   NA  Current AVH:  No  Plan for Discharge/Comments:  Patient attending discharge planning group and actively participated in group.  He reports admitting to detox from alcohol.  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  He will need a referral for follow up and is considering CD-IOP.  CSW provided all participants with daily workbook.   Transportation Means: Patient has transportation.   Supports:  Patient has a support system.   Ambur Province, Joesph July

## 2013-02-19 NOTE — Progress Notes (Signed)
Writer spent 1:1 time with pt. Pt. Expressed that he is frustrated with the way things are going. He feels that no one can give him a straight answer as to when he will be d/c and what exactly the outpatient entails. Pt. Stated with his Aspergers syndrome he likes to know what is going on and likes to be able to have things scheduled and planned out. Pt. Stated the reason his BP was running high earlier in the day was due to a stressful phone call dealing with work and having just recently meeting with the doctor and not getting a straight answer. Pt. Stated he has an appointment with Dr.A in December and was unaware that Dr. Mervyn Skeeters was a doctor at this facility. Pt. Stated he wish that would have been told to him. Pt. Stated he feels that he has completed his treatment dealing with his withdrawals and that he is not experiencing  any symptoms anymore. Pt. Stated he needs to get back to work as soon as possible before his company fails. Pt stated his ex wife keeps calling him, stressing out, because she does not know how to run the company and he is unable to do much while here. Pt. Stated depending on what the outpatient program is, he may or may not be compliant with it and may decided to find another means of recovery. Pt. Stated he has a very supportive family and doesn't think a 5-7 week program would be necessary. Pt stated he has done therapy for months at a time and he felt enabled, and he doesn't want to do anything like that again.

## 2013-02-19 NOTE — Tx Team (Deleted)
Interdisciplinary Treatment Plan Update   Date Reviewed:  02/19/2013  Time Reviewed:  12:01 PM  Progress in Treatment:   Attending groups: Yes Participating in groups: Yes Taking medication as prescribed: Yes  Tolerating medication: Yes Family/Significant other contact made: No, but will ask patient for consent for collateral contact Patient understands diagnosis: Yes  Discussing patient identified problems/goals with staff: Yes Medical problems stabilized or resolved: Yes Denies suicidal/homicidal ideation: Yes Patient has not harmed self or others: Yes  For review of initial/current patient goals, please see plan of care.  Estimated Length of Stay:  2-3 days  Reasons for Continued Hospitalization:  Detox proctocl Medication stabilization   New Problems/Goals identified:    Discharge Plan or Barriers:   Home with outpatient follow up to be determined  Additional Comments:  Nathan Frey is an 42 y.o. male dx's with Asperger's. Patient presents to the emergency department for detox. He reports he wants detox from alcohol and cocaine. Patient started drinking at age 5. He does admit to drinking a pint of liqour a day for the past 5-6 yrs. His last drink was 0730 today. He also states he tried cocaine at age 74. His cocaine usage has grown more consistent over the past 5-6 yrs and worsened in the past yr. Patient reports spending $1000 per week on cocaine in the last 1- 1/2 year. He also states that over the past 1-1/2 years his cocaine usage increased b/c his psychiatrist-[name] was trying to take him off his Xanax. Patient reports no withdrawal symptoms. He has no history of black outs or seizures.   Attendees:  Patient:  02/19/2013 12:01 PM   Signature: Mervyn Gay, MD 02/19/2013 12:01 PM  Signature:  Verne Spurr, PA 02/19/2013 12:01 PM  Signature: Onnie Boer, RN   [facility] 02/19/2013 12:01 PM  Signature:Beverly Terrilee Croak, RN 02/19/2013 12:01 PM  Signature:  Neill Loft RN 02/19/2013 12:01 PM  Signature:  Juline Patch, LCSW 02/19/2013 12:01 PM  Signature:  Reyes Ivan, LCSW 02/19/2013 12:01 PM  Signature:  Maseta Dorley,Care Coordinator 02/19/2013 12:01 PM  Signature:  Marzetta Board, RN 02/19/2013 12:01 PM  Signature:  02/19/2013  12:01 PM  Signature:    02/19/2013  12:01 PM  Signature:   02/19/2013  12:01 PM    Scribe for Treatment Team:   Juline Patch,  02/19/2013 12:01 PM

## 2013-02-19 NOTE — [facility] Group Notes (Signed)
[facility] LCSW Group Therapy          Overcoming Obstacles       1:15 -2:30            02/19/2013  2:46 PM   Type of Therapy:  Group Therapy  Participation Level:  Appropriate  Participation Quality:  Appropriate  Affect:  Appropriate, Alert  Cognitive:  Attentive Appropriate  Insight: Developing/Improving Engaged  Engagement in Therapy: Developing/Imprvoing Engaged  Modes of Intervention:  Discussion Exploration  Education Rapport BuildingProblem-Solving Support  Summary of Progress/Problems:  The main focus of today's group was overcoming obstacles.   He advised his obstacle is working too much.  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  Patient able to identify appropriate coping skills.   Wynn Banker 02/19/2013   2:46 PM

## 2013-02-19 NOTE — Progress Notes (Signed)
[facility] MD Progress Note  02/19/2013 2:10 PM Nathan Frey  MRN:  [id number] Subjective:  The patient has been compliant with his inpatient treatment program and medication management without significant adverse effects or withdrawal symptoms. Patient has no complaints today. Patient appeared sitting on his bed and reading his book without distress. Patient stated he likes to be receiving outpatient psychiatric services in [place] instead of outside the state. Patient has been in contact with his family members who are supportive. Patient interested to participate in the alcoholic anonymous and noncardiac anonymous group and outpatient substance abuse counseling but not interested in residential treatment program at this time. Patient contracted for safety  Diagnosis:   DSM5: Schizophrenia Disorders:   Obsessive-Compulsive Disorders:   Trauma-Stressor Disorders:   Substance/Addictive Disorders:  Alcohol Intoxication (303.00) and Alcohol Withdrawal (291.81) Depressive Disorders:  Major Depressive Disorder - Unspecified (296.20)  Axis I: Substance Induced Mood Disorder and Alcohol dependence and cocaine abuse versus dependence  ADL's:  Intact  Sleep: Fair  Appetite:  Fair  Suicidal Ideation:  Denied Homicidal Ideation:  Denied AEB (as evidenced by):  Psychiatric Specialty Exam: ROS  Blood pressure 138/88, pulse 112, temperature 97.6 F (36.4 C), temperature source Oral, resp. rate 16, height 5\' 6"  (1.676 m), weight 66.225 kg (146 lb).Body mass index is 23.58 kg/(m^2).  General Appearance: Disheveled  Eye Solicitor::  Fair  Speech:  Normal Rate  Volume:  Decreased  Mood:  Depressed  Affect:  Constricted  Thought Process:  Goal Directed and Intact  Orientation:  Full (Time, Place, and Person)  Thought Content:  WDL  Suicidal Thoughts:  No  Homicidal Thoughts:  No  Memory:  Immediate;   Fair  Judgement:  Impaired  Insight:  Lacking  Psychomotor Activity:  Psychomotor  Retardation  Concentration:  Fair  Recall:  Good  Akathisia:  NA  Handed:  Right  AIMS (if indicated):     Assets:  Communication Skills Desire for Improvement Financial Resources/Insurance Housing Physical Health Resilience Social Support Transportation  Sleep:  Number of Hours: 6.75   Current Medications: Current Facility-Administered Medications  Medication Dose Route Frequency Provider Last Rate Last Dose  . acetaminophen (TYLENOL) tablet 650 mg  650 mg Oral Q4H PRN Earney Navy, NP      . alum & mag hydroxide-simeth (MAALOX/MYLANTA) 200-200-20 MG/5ML suspension 30 mL  30 mL Oral PRN Earney Navy, NP      . ibuprofen (ADVIL,MOTRIN) tablet 600 mg  600 mg Oral Q8H PRN Earney Navy, NP      . LORazepam (ATIVAN) tablet 0-4 mg  0-4 mg Oral Q6H Earney Navy, NP   1 mg at 02/18/13 1855   Followed by  . LORazepam (ATIVAN) tablet 0-4 mg  0-4 mg Oral Q12H Earney Navy, NP      . neomycin-bacitracin-polymyxin (NEOSPORIN) ointment   Topical BID Nehemiah Settle, MD   1 application at 02/19/13 0818  . [START ON 02/20/2013] nicotine (NICODERM CQ - dosed in mg/24 hours) patch 21 mg  21 mg Transdermal Daily Verne Spurr, PA-C   21 mg at 02/19/13 0818  . ondansetron (ZOFRAN) tablet 4 mg  4 mg Oral Q8H PRN Earney Navy, NP      . thiamine (VITAMIN B-1) tablet 100 mg  100 mg Oral Daily Earney Navy, NP   100 mg at 02/19/13 0818   Or  . thiamine (B-1) injection 100 mg  100 mg Intravenous Daily Earney Navy, NP      .  zolpidem (AMBIEN) tablet 10 mg  10 mg Oral QHS PRN Earney Navy, NP   10 mg at 02/18/13 2114    Lab Results: No results found for this or any previous visit (from the past 48 hour(s)).  Physical Findings: AIMS: Facial and Oral Movements Muscles of Facial Expression: None, normal Lips and Perioral Area: None, normal Jaw: None, normal Tongue: None, normal,Extremity Movements Upper (arms, wrists, hands, fingers):  None, normal Lower (legs, knees, ankles, toes): None, normal, Trunk Movements Neck, shoulders, hips: None, normal, Overall Severity Severity of abnormal movements (highest score from questions above): None, normal Incapacitation due to abnormal movements: None, normal Patient's awareness of abnormal movements (rate only patient's report): No Awareness, Dental Status Current problems with teeth and/or dentures?: No Does patient usually wear dentures?: No  CIWA:  CIWA-Ar Total: 0 COWS:     Treatment Plan Summary: Daily contact with patient to assess and evaluate symptoms and progress in treatment Medication management  Plan: Treatment Plan/Recommendations:   1. Admit for crisis management and stabilization. 2. Medication management to reduce current symptoms to base line and improve the patient's overall level of functioning. 3. Treat health problems as indicated. 4. Develop treatment plan to decrease risk of relapse upon discharge and to reduce the need for readmission. 5. Psycho-social education regarding relapse prevention and self care. 6. Health care follow up as needed for medical problems. 7. Restart home medications where appropriate. 8. Disposition plans are in progress and may be discharged in one to 2 days if he can continue contract for safety and clinical improvement.  Medical Decision Making Problem Points:  Established problem, worsening (2), Review of last therapy session (1) and Review of psycho-social stressors (1) Data Points:  Review or order clinical lab tests (1) Review or order medicine tests (1) Review of medication regiment & side effects (2) Review of new medications or change in dosage (2)  I certify that inpatient services furnished can reasonably be expected to improve the patient's condition.   Nehemiah Settle., M.D. 02/19/2013, 2:10 PM

## 2013-02-19 NOTE — Progress Notes (Signed)
Adult Psychoeducational Group Note  Date:  02/19/2013 Time:  10:19 PM  Group Topic/Focus:  Wrap-Up Group:   The focus of this group is to help patients review their daily goal of treatment and discuss progress on daily workbooks.  Participation Level:  Active  Participation Quality:  Appropriate and Attentive  Affect:  Appropriate  Cognitive:  Alert and Appropriate  Insight: Appropriate and Good  Engagement in Group:  Engaged  Modes of Intervention:  Discussion  Additional Comments: Pt attended group this evening and was appropriate and attentive during group.     [place] Shi K 02/19/2013, 10:19 PM

## 2013-02-19 NOTE — Consult Note (Signed)
This case was discussed with NP. Agree with assessment and plan.  Ancil Linsey, MD

## 2013-02-19 NOTE — Progress Notes (Signed)
[name], male   DOB: [birth year], 42 y.o.   MRN: [id number] D-Patient reports he slept well and his appetite is good.  His energy level is normal and his ability to pay attention is good.  He denies any detox symptoms.  He reports he has a supportive wife and mother.  He says he plans to follow up outpatient and go to NA/AA.  Says he would like to transition from inpatient to outpatient.  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 Says his wife is covering for him and he would like to give her relief. Says he would like to go to counseling 1-2 times /week.  Also expressed concern about coming across "defensive" when he did not want to disclose some things in group.  Reassured patient that he has the right to disclose what he feels comfortable with. Applied ointment to cracked area in L palm of hand.  It appears to be healing

## 2013-02-19 NOTE — Progress Notes (Signed)
[facility] Group Notes:  (Nursing/MHT/Case Management/Adjunct)  Date:  02/18/2013 Time:  8:00 p.m.   Type of Therapy:  Psychoeducational Skills  Participation Level:  Active  Participation Quality:  Attentive  Affect:  Appropriate  Cognitive:  Appropriate  Insight:  Improving  Engagement in Group:  Improving  Modes of Intervention:  Education  Summary of Progress/Problems: Patient states that he had a great day. He attributed his positive experience to having had a good visit with his wife, mother, and daughter. As for the theme of the day, he indicated that his social support system consists of his family, A. A., and N.A.  Incidentally, the patient was offered an opportunity to attend the A.A. Meeting on the 300 hallway and refused due to having recently taking some medication.   Flint Hakeem S 02/19/2013, 1:08 AM

## 2013-02-19 NOTE — Progress Notes (Signed)
Adult Psychoeducational Group Note  Date:  02/19/2013 Time:  11:00am Group Topic/Focus:  Wellness Toolbox:   The focus of this group is to discuss various aspects of wellness, balancing those aspects and exploring ways to increase the ability to experience wellness.  Patients will create a wellness toolbox for use upon discharge.  Participation Level:  Active  Participation Quality:  Appropriate and Attentive  Affect:  Appropriate  Cognitive:  Alert and Appropriate  Insight: Appropriate  Engagement in Group:  Engaged  Modes of Intervention:  Discussion and Education  Additional Comments:   Pt attended and participated in group. Discussion was on wellness. The question was asked; What does wellness mean to you? Pt stated wellness means state of mind, who you are, and your stability.  Shelly Bombard D 02/19/2013, 1:28 PM

## 2013-02-19 NOTE — Progress Notes (Signed)
Recreation Therapy Notes  Date: 11.17.2014 Time: 3:00pm Location: 500 Hall Dayroom  Group Topic: Gratitude  Goal Area(s) Addresses:  Patient will be able to identify things they are grateful for. Patient will be able to identify benefit of recognizing things they are grateful for.   Behavioral Response: Engaged, Appropriate  Intervention: Mandala   Activity: Patients were provided worksheet with "I am Grateful For" surrounded by categories such as - Happiness, Laughter; Work, Play, Rest; Knowledge, Education. Using these categories patients were asked identify 2-3 things they are grateful that fit into each category.   Education: Runner, broadcasting/film/video, Pharmacologist, Self-expression  Education Outcome: Acknowledges understanding.   Clinical Observations/Feedback: Patient actively engaged in activity, identifying things he is grateful for to correspond with each category. Patient made no contributions to group discussion, but appeared to actively listen as he maintained appropriate eye contact with speaker.    Marykay Lex Orlene Salmons, LRT/CTRS  Allanah Mcfarland L 02/19/2013 4:00 PM

## 2013-02-19 NOTE — Tx Team (Signed)
Interdisciplinary Treatment Plan Update   Date Reviewed:  02/19/2013  Time Reviewed:  12:30 PM  Progress in Treatment:   Attending groups: Yes Participating in groups: Yes Taking medication as prescribed: Yes  Tolerating medication: Yes Family/Significant other contact made: No, but will ask patient for consent for collateral contact Patient understands diagnosis: Yes  Discussing patient identified problems/goals with staff: Yes Medical problems stabilized or resolved: Yes Denies suicidal/homicidal ideation: Yes Patient has not harmed self or others: Yes  For review of initial/current patient goals, please see plan of care.  Estimated Length of Stay:  2-3 days  Reasons for Continued Hospitalization:  Detox proctocl Medication stabilization   New Problems/Goals identified:    Discharge Plan or Barriers:   Home with outpatient follow up to be determined  Additional Comments:  Nathan Frey is an 42 y.o. male dx's with Asperger's. Patient presents to the emergency department for detox. He reports he wants detox from alcohol and cocaine. Patient started drinking at age 38. He does admit to drinking a pint of liqour a day for the past 5-6 yrs. His last drink was 0730 today. He also states he tried cocaine at age 55. His cocaine usage has grown more consistent over the past 5-6 yrs and worsened in the past yr. Patient reports spending $1000 per week on cocaine in the last 1- 1/2 year. He also states that over the past 1-1/2 years his cocaine usage increased b/c his psychiatrist-[name] was trying to take him off his Xanax. Patient reports no withdrawal symptoms. He has no history of black outs or seizures.   Attendees:  Patient:  02/19/2013 12:30 PM   Signature: Mervyn Gay, MD 02/19/2013 12:30 PM  Signature:  Verne Spurr, PA 02/19/2013 12:30 PM  Signature: Onnie Boer, RN   [facility] 02/19/2013 12:30 PM  Signature:Beverly Terrilee Croak, RN 02/19/2013 12:30 PM  Signature:  Neill Loft RN 02/19/2013 12:30 PM  Signature:  Juline Patch, LCSW 02/19/2013 12:30 PM  Signature:  Reyes Ivan, LCSW 02/19/2013 12:30 PM  Signature:  Sharin Grave Coordinator 02/19/2013 12:30 PM  Signature:  Marzetta Board, RN 02/19/2013 12:30 PM  Signature:  02/19/2013  12:30 PM  Signature:    02/19/2013  12:30 PM  Signature:   02/19/2013  12:30 PM    Scribe for Treatment Team:   Juline Patch,  02/19/2013 12:30 PM

## 2013-02-20 MED ORDER — ZOLPIDEM TARTRATE 10 MG PO TABS: 10.0000 mg | ORAL_TABLET | Freq: Every evening | ORAL | Status: DC | PRN

## 2013-02-20 NOTE — Progress Notes (Signed)
The focus of this group is to educate the patient on the purpose and policies of crisis stabilization and provide a format to answer questions about their admission.  The group details unit policies and expectations of patients while admitted.  Patient attended 0900 nurse education orientation group this morning.  Patient listened, appropriate affect, alert, appropriate insight and engagement.  Today patient will work on 3 goals for discharge.  

## 2013-02-20 NOTE — Progress Notes (Signed)
Patient reported feeling better and ready for discharged tomorrow. He stated ; "I completed my detox and I'm ready to go tomorrow. Patent stated that his mother and wife are recovery alcoholics and that they are very supportive and actually encouraged him to get help here. He said they will all be going to AA/NA meeting together and that he would use the coping skills he learned form here at home. Patient requested for Ambien at bedtime. Med administered as ordered and patient tolerated the medication. Writer encouraged and supported patient. Q 15 minute check continues as ordered to maintain safety.

## 2013-02-20 NOTE — Progress Notes (Signed)
[facility] Adult Case Management Discharge Plan :  Will you be returning to the same living situation after discharge: Yes,  Patient will return to previous living arrangement At discharge, do you have transportation home?:Yes,  Patient will arrange transportation home. Do you have the ability to pay for your medications:Yes,  Patient is able to afford medications.  Release of information consent forms completed and in the chart;  Patient's signature needed at discharge.  Patient to Follow up at: Follow-up Information   Follow up with Charmian Muff - CD - IOP [facility] On 02/21/2013. (You are scheduled to start CD-IOP on Wednesday, February 21, 2013 at 10:00 AM)    Contact information:   [address] [place], [place]   [address]  [phone]      Patient denies SI/HI:  Patient no longer endorsing SI/HI or other thoughts of self harm.   Safety Planning and Suicide Prevention discussed: .Reviewed with all patients during discharge planning group  Kasheena Sambrano, [place] July 02/20/2013, 11:24 AM

## 2013-02-20 NOTE — Discharge Summary (Signed)
Physician Discharge Summary Note  Patient:  Nathan Frey is an 42 y.o., male MRN:  [id number] DOB:  13-Nov-1970 Patient phone:  [phone] (home)  Patient address:   [address]   [place] [address],   Date of Admission:  02/17/2013 Date of Discharge: 02/20/2013  Reason for Admission:  Alcohol dependence, Cocaine abuse  Discharge Diagnoses: Principal Problem:   Alcohol dependence Active Problems:   MDD (major depressive disorder), recurrent severe, without psychosis   Generalized anxiety disorder  ROS  DSM5: Schizophrenia Disorders:  Obsessive-Compulsive Disorders:  Trauma-Stressor Disorders:  Substance/Addictive Disorders: Alcohol Intoxication with Use Disorder - Severe (F10.229)  Depressive Disorders: Major Depressive Disorder - Severe (296.23)  AXIS I: Generalized Anxiety Disorder, Major Depression, Recurrent severe, Substance Induced Mood Disorder and Alcohol dependence and cocaine abuse  AXIS II: Deferred  AXIS III:  Past Medical History   Diagnosis  Date   .  Asperger's disorder    .  Anxiety     AXIS IV: economic problems, occupational problems, other psychosocial or environmental problems, problems related to social environment and problems with primary support group  AXIS V: 41-50 serious symptoms   Level of Care:  OP  Hospital Course:  Nathan Frey is a 42 year old white male who presented to the [facility] requesting detox from alcohol and cocaine. He reported drinking a pint of liquor per day and using $1000 worth of cocaine a week. He denied a history of blackouts or withdrawal seizures. He was accepted to [facility] and given medical clearance. His UDS was + for cocaine and his BAL was 237.      Nathan Frey admitted for alcohol, and cocaine detox.  He was treated with the standard protocol for detox.  Medical problems were identified and treated. Home medications was restarted as appropriate.             Improvement was monitored by CIWA scores and  patient's daily report of withdrawal symptom reduction.  Emotional and mental status was monitored by daily self inventory reports completed by the patient and clinical staff.       Nathan Frey was encouraged to participate in unit programming to learn coping skills, problem solving, relaxation techniques, as well as NA/AA programs.      He was offered further treatment options upon discharge including Residential, Intensive Outpatient and Outpatient treatment.  The patient's motivation was an integral factor for scheduling further treatment.     Upon completion of detox the patient was both and mentally and medically stable for discharge. The patient vehemently denied SI/HI or AVH as well as any withdrawal symptoms.  Consults:  None  Significant Diagnostic Studies:  labs: CBC, CMP, UA, UDS, BAL  Discharge Vitals:   Blood pressure 134/94, pulse 112, temperature 98.3 F (36.8 C), temperature source Oral, resp. rate 20, height 5\' 6"  (1.676 m), weight 66.225 kg (146 lb). Body mass index is 23.58 kg/(m^2). Lab Results:   No results found for this or any previous visit (from the past 72 hour(s)).  Physical Findings: AIMS: Facial and Oral Movements Muscles of Facial Expression: None, normal Lips and Perioral Area: None, normal Jaw: None, normal Tongue: None, normal,Extremity Movements Upper (arms, wrists, hands, fingers): None, normal Lower (legs, knees, ankles, toes): None, normal, Trunk Movements Neck, shoulders, hips: None, normal, Overall Severity Severity of abnormal movements (highest score from questions above): None, normal Incapacitation due to abnormal movements: None, normal Patient's awareness of abnormal movements (rate only patient's report): No Awareness, Dental Status Current  problems with teeth and/or dentures?: No Does patient usually wear dentures?: No  CIWA:  CIWA-Ar Total: 3 COWS:     Psychiatric Specialty Exam: See Psychiatric Specialty Exam and Suicide Risk Assessment  completed by Attending Physician prior to discharge.  Discharge destination:  Home  Is patient on multiple antipsychotic therapies at discharge:  No   Has Patient had three or more failed trials of antipsychotic monotherapy by history:  No  Recommended Plan for Multiple Antipsychotic Therapies: NA  Discharge Orders   Future Orders Complete By Expires   Diet - low sodium heart healthy  As directed    Discharge instructions  As directed    Comments:     Take all of your medications as directed. Be sure to keep all of your follow up appointments.  If you are unable to keep your follow up appointment, call your Doctor's office to let them know, and reschedule.  Make sure that you have enough medication to last until your appointment. Be sure to get plenty of rest. Going to bed at the same time each night will help. Try to avoid sleeping during the day.  Increase your activity as tolerated. Regular exercise will help you to sleep better and improve your mental health. Eating a heart healthy diet is recommended. Try to avoid salty or fried foods. Be sure to avoid all alcohol and illegal drugs.   Increase activity slowly  As directed        Medication List       Indication   zolpidem 10 MG tablet  Commonly known as:  AMBIEN  Take 1 tablet (10 mg total) by mouth at bedtime as needed for sleep.   Indication:  Trouble Sleeping           Follow-up Information   Follow up with Charmian Muff - CD - IOP [facility] On 02/21/2013. (You are scheduled to start CD-IOP on Wednesday, February 21, 2013 at 10:00 AM)    Contact information:   [address] [place], [place]   [address]  [phone]      Follow-up recommendations:   Activities: Resume activity as tolerated. Diet: Heart healthy low sodium diet Tests: Follow up testing will be determined by your out patient provider. Comments:    Total Discharge Time:  Less than 30 minutes.  Signed: MASHBURN,NEIL 02/20/2013, 11:14  AM  Patient was seen personally for psychiatric evaluation, suicide risk assessment and case discussed with her physician extender and formulate a treatment and disposition plan.Reviewed the information documented and agree with the treatment plan.  Nehemiah Settle., M.D. 02/21/2013 4:28 PM

## 2013-02-20 NOTE — [facility] Suicide Risk Assessment (Signed)
Suicide Risk Assessment  Discharge Assessment     Demographic Factors:  Male, Adolescent or young adult and Caucasian  Mental Status Per Nursing Assessment::   On Admission:     Current Mental Status by Physician: NA  Loss Factors: NA  Historical Factors: Family history of mental illness or substance abuse and Impulsivity  Risk Reduction Factors:   Sense of responsibility to family, Religious beliefs about death, Living with another person, especially a relative, Positive social support, Positive therapeutic relationship and Positive coping skills or problem solving skills  Continued Clinical Symptoms:  Depression:   Recent sense of peace/wellbeing Alcohol/Substance Abuse/Dependencies  Cognitive Features That Contribute To Risk:  Polarized thinking    Suicide Risk:  Minimal: No identifiable suicidal ideation.  Patients presenting with no risk factors but with morbid ruminations; may be classified as minimal risk based on the severity of the depressive symptoms  Discharge Diagnoses:   AXIS I:  Substance Induced Mood Disorder and Alochol dependence and cocaine abuse AXIS II:  Deferred AXIS III:   Past Medical History  Diagnosis Date  . Asperger's disorder   . Anxiety    AXIS IV:  occupational problems, other psychosocial or environmental problems, problems related to social environment and problems with primary support group AXIS V:  61-70 mild symptoms  Plan Of Care/Follow-up recommendations:  Activity:  As tolerated Diet:  Regular  Is patient on multiple antipsychotic therapies at discharge:  No   Has Patient had three or more failed trials of antipsychotic monotherapy by history:  No  Recommended Plan for Multiple Antipsychotic Therapies: NA  Nehemiah Settle., MD 02/20/2013, 11:39 AM

## 2013-02-20 NOTE — Progress Notes (Signed)
[name], male   DOB: [birth year], 42 y.o.   MRN: [id number] Discharge Note:  Patient discharged home with wife.  Denied SI and HI.  Denied A/V hallucinations.  Denied pain.  Suicide prevention information given and discussed with patient who stated he understood and had no questions.  Patient received all his belongings, clothing, miscellaneous items, toiletries, prescriptions.  Patient stated he appreciated all assistance received with [facility] staff.

## 2013-02-20 NOTE — Progress Notes (Signed)
D:  Patient's self inventory sheet, patient sleeps well, good appetite, normal energy level, good attention span.  Denied depression and hopelessness.  Anxiety #2.  Denied withdrawals.  Denied SI.  Denied physical problems.  Plans to attend NA/AA meeting, exercise, outpatient treatment, continue counseling, go to MD, spend time with family, slow down.  Does have discharge plans.  No problems with medications after discharge. A:  Medications administered per MD orders.  Emotional support and encouragement given patient. R:  Denied SI and HI.  Denied A/V hallucinations.  Will continue to monitor patient for safety with 15 minute checks.  Safety maintained.

## 2013-02-23 NOTE — Progress Notes (Signed)
Patient Discharge Instructions:  After Visit Summary (AVS):   Faxed to:  02/23/13 Discharge Summary Note:   Faxed to:  02/23/13 Psychiatric Admission Assessment Note:   Faxed to:  02/23/13 Suicide Risk Assessment - Discharge Assessment:   Faxed to:  02/23/13 Faxed/Sent to the Next Level Care provider:  02/23/13 Next Level Care Provider Has Access to the EMR, 02/23/13 Faxed to [facility] @ [phone] Records provided to [facility] via [facility] Access.  Jerelene Redden, 02/23/2013, 1:43 PM

## 2013-06-05 ENCOUNTER — Ambulatory Visit (INDEPENDENT_AMBULATORY_CARE_PROVIDER_SITE_OTHER): Payer: BC Managed Care – PPO | Admitting: Family

## 2013-06-05 ENCOUNTER — Encounter: Payer: Self-pay | Admitting: Family

## 2013-06-05 VITALS — BP 120/80 | HR 88 | Ht 66.0 in | Wt 153.0 lb

## 2013-06-05 DIAGNOSIS — F1021 Alcohol dependence, in remission: Secondary | ICD-10-CM

## 2013-06-05 DIAGNOSIS — F1911 Other psychoactive substance abuse, in remission: Secondary | ICD-10-CM | POA: Insufficient documentation

## 2013-06-05 DIAGNOSIS — F411 Generalized anxiety disorder: Secondary | ICD-10-CM

## 2013-06-05 MED ORDER — FLUOXETINE HCL 20 MG PO TABS: 20.0000 mg | ORAL_TABLET | Freq: Every day | ORAL | Status: DC

## 2013-06-05 MED ORDER — BUSPIRONE HCL 15 MG PO TABS: 15.0000 mg | ORAL_TABLET | Freq: Three times a day (TID) | ORAL | Status: DC

## 2013-06-05 NOTE — Progress Notes (Signed)
Pre visit review using our clinic review tool, if applicable. No additional management support is needed unless otherwise documented below in the visit note. 

## 2013-06-05 NOTE — Patient Instructions (Signed)
Panic Attacks  Panic attacks are sudden, short-lived surges of severe anxiety, fear, or discomfort. They may occur for no reason when you are relaxed, when you are anxious, or when you are sleeping. Panic attacks may occur for a number of reasons:   · Healthy people occasionally have panic attacks in extreme, life-threatening situations, such as war or natural disasters. Normal anxiety is a protective mechanism of the body that helps us react to danger (fight or flight response).  · Panic attacks are often seen with anxiety disorders, such as panic disorder, social anxiety disorder, generalized anxiety disorder, and phobias. Anxiety disorders cause excessive or uncontrollable anxiety. They may interfere with your relationships or other life activities.  · Panic attacks are sometimes seen with other mental illnesses such as depression and posttraumatic stress disorder.  · Certain medical conditions, prescription medicines, and drugs of abuse can cause panic attacks.  SYMPTOMS   Panic attacks start suddenly, peak within 20 minutes, and are accompanied by four or more of the following symptoms:  · Pounding heart or fast heart rate (palpitations).  · Sweating.  · Trembling or shaking.  · Shortness of breath or feeling smothered.  · Feeling choked.  · Chest pain or discomfort.  · Nausea or strange feeling in your stomach.  · Dizziness, lightheadedness, or feeling like you will faint.  · Chills or hot flushes.  · Numbness or tingling in your lips or hands and feet.  · Feeling that things are not real or feeling that you are not yourself.  · Fear of losing control or going crazy.  · Fear of dying.  Some of these symptoms can mimic serious medical conditions. For example, you may think you are having a heart attack. Although panic attacks can be very scary, they are not life threatening.  DIAGNOSIS   Panic attacks are diagnosed through an assessment by your health care provider. Your health care provider will ask questions  about your symptoms, such as where and when they occurred. Your health care provider will also ask about your medical history and use of alcohol and drugs, including prescription medicines. Your health care provider may order blood tests or other studies to rule out a serious medical condition. Your health care provider may refer you to a mental health professional for further evaluation.  TREATMENT   · Most healthy people who have one or two panic attacks in an extreme, life-threatening situation will not require treatment.  · The treatment for panic attacks associated with anxiety disorders or other mental illness typically involves counseling with a mental health professional, medicine, or a combination of both. Your health care provider will help determine what treatment is best for you.  · Panic attacks due to physical illness usually goes away with treatment of the illness. If prescription medicine is causing panic attacks, talk with your health care provider about stopping the medicine, decreasing the dose, or substituting another medicine.  · Panic attacks due to alcohol or drug abuse goes away with abstinence. Some adults need professional help in order to stop drinking or using drugs.  HOME CARE INSTRUCTIONS   · Take all your medicines as prescribed.    · Check with your health care provider before starting new prescription or over-the-counter medicines.  · Keep all follow up appointments with your health care provider.  SEEK MEDICAL CARE IF:  · You are not able to take your medicines as prescribed.  · Your symptoms do not improve or get worse.  SEEK IMMEDIATE   MEDICAL CARE IF:   · You experience panic attack symptoms that are different than your usual symptoms.  · You have serious thoughts about hurting yourself or others.  · You are taking medicine for panic attacks and have a serious side effect.  MAKE SURE YOU:  · Understand these instructions.  · Will watch your condition.  · Will get help right away  if you are not doing well or get worse.  Document Released: 03/22/2005 Document Revised: 01/10/2013 Document Reviewed: 11/03/2012  ExitCare® Patient Information ©2014 [employer].

## 2013-06-05 NOTE — Progress Notes (Signed)
   Subjective:    [name], male    DOB: 07/23/1970, 43 y.o.   MRN: [id number]  HPI 43 year old white male, nonsmoker with a history of Asperger's, alcohol dependence, cocaine dependence with a long-standing history of anxiety untreated. Reports he's been without cocaine for approximately 5 months and clean of alcohol approximately 3 months. Not currently under any psychotherapy. Reports being in therapy for 3 months  September to November. Reports being anxious, difficulty sleeping, difficulty concentrating, crying spells and overwhelmed. Has been on Prozac elevation in the past that he believes may have worked well but is unsure. Has been on Paxil because of nausea and vomiting.   Review of Systems  Constitutional: Negative.   Respiratory: Negative.   Cardiovascular: Negative.   Musculoskeletal: Negative.   Skin: Negative.   Allergic/Immunologic: Negative.   Neurological: Negative.   Psychiatric/Behavioral: Positive for behavioral problems, sleep disturbance and agitation.   Past Medical History  Diagnosis Date  . Asperger's disorder   . Anxiety   . Alcohol abuse     History   Social History  . Marital Status: Single    Spouse Name: N/A    Number of Children: N/A  . Years of Education: N/A   Occupational History  . Not on file.   Social History Main Topics  . Smoking status: Former Smoker    Types: Cigarettes  . Smokeless tobacco: Former NeurosurgeonUser    Types: Chew  . Alcohol Use: No     Comment: dailly usage/ pt reports recovering (3 months clean)  . Drug Use: No     Comment: pt reports being clean 4-5 months from cocaine  . Sexual Activity: Not on file   Other Topics Concern  . Not on file   Social History Narrative  . No narrative on file    History reviewed. No pertinent past surgical history.  No family history on file.  No Known Allergies  No current outpatient prescriptions on file prior to visit.   No current facility-administered  medications on file prior to visit.    BP 120/80  Pulse 88  Ht 5\' 6"  (1.676 m)  Wt 153 lb (69.4 kg)  BMI 24.71 kg/m2chart    Objective:   Physical Exam  Constitutional: He is oriented to person, place, and time. He appears well-developed and well-nourished.  Neck: Normal range of motion. Neck supple.  Cardiovascular: Normal rate, regular rhythm and normal heart sounds.   Pulmonary/Chest: Effort normal and breath sounds normal.  Neurological: He is alert and oriented to person, place, and time.  Skin: Skin is warm and dry.  Psychiatric:  Anxious          Assessment & Plan:  Vernia BuffMarc was seen today for establish care and anxiety.  Diagnoses and associated orders for this visit:  Substance abuse in remission  Alcohol dependence in remission  Generalized anxiety disorder  Other Orders - FLUoxetine (PROZAC) 20 MG tablet; Take 1 tablet (20 mg total) by mouth daily. - busPIRone (BUSPAR) 15 MG tablet; Take 1 tablet (15 mg total) by mouth 3 (three) times daily.   Call the office with any questions or concerns. Recheck in 1 week and sooner as needed.

## 2013-06-11 ENCOUNTER — Telehealth: Payer: Self-pay | Admitting: Family

## 2013-06-11 MED ORDER — BUPROPION HCL ER (XL) 150 MG PO TB24: 150.0000 mg | ORAL_TABLET | Freq: Every day | ORAL | Status: DC

## 2013-06-11 NOTE — Telephone Encounter (Signed)
Pt was under the impression you were going to rx him welbutrin along w/ the prozac.  That is what has worked  for him in the past. Pt states he doesn't know is buspar is working.  Pt would like someone to call back.  [facility]

## 2013-06-11 NOTE — Telephone Encounter (Signed)
I was not going to prescribe the both medications at the same time. I will send in now. Must understand this medication is not immediate and will take time. Keep follow-up appt.

## 2013-06-11 NOTE — Telephone Encounter (Signed)
Left message to advise pt of note 

## 2013-06-11 NOTE — Telephone Encounter (Signed)
Please advise 

## 2013-06-13 ENCOUNTER — Other Ambulatory Visit: Payer: Self-pay

## 2013-06-18 ENCOUNTER — Encounter: Payer: Self-pay | Admitting: Family

## 2013-06-19 ENCOUNTER — Telehealth: Payer: Self-pay | Admitting: Family

## 2013-06-19 ENCOUNTER — Other Ambulatory Visit (INDEPENDENT_AMBULATORY_CARE_PROVIDER_SITE_OTHER): Payer: BC Managed Care – PPO

## 2013-06-19 DIAGNOSIS — Z0000 Encounter for general adult medical examination without abnormal findings: Secondary | ICD-10-CM

## 2013-06-19 LAB — BASIC METABOLIC PANEL WITH GFR
BUN: 16 mg/dL (ref 6–23)
CO2: 30 meq/L (ref 19–32)
Calcium: 9.2 mg/dL (ref 8.4–10.5)
Chloride: 103 meq/L (ref 96–112)
Creatinine, Ser: 0.9 mg/dL (ref 0.4–1.5)
GFR: 101.31 mL/min
Glucose, Bld: 101 mg/dL — ABNORMAL HIGH (ref 70–99)
Potassium: 3.9 meq/L (ref 3.5–5.1)
Sodium: 138 meq/L (ref 135–145)

## 2013-06-19 LAB — POCT URINALYSIS DIPSTICK
Bilirubin, UA: NEGATIVE
Blood, UA: NEGATIVE
Glucose, UA: NEGATIVE
Ketones, UA: NEGATIVE
Leukocytes, UA: NEGATIVE
Nitrite, UA: NEGATIVE
Spec Grav, UA: 1.02
Urobilinogen, UA: 0.2
pH, UA: 8.5

## 2013-06-19 LAB — HEPATIC FUNCTION PANEL
ALT: 38 U/L (ref 0–53)
AST: 17 U/L (ref 0–37)
Albumin: 4.6 g/dL (ref 3.5–5.2)
Alkaline Phosphatase: 37 U/L — ABNORMAL LOW (ref 39–117)
Bilirubin, Direct: 0.1 mg/dL (ref 0.0–0.3)
Total Bilirubin: 0.7 mg/dL (ref 0.3–1.2)
Total Protein: 7.7 g/dL (ref 6.0–8.3)

## 2013-06-19 LAB — CBC WITH DIFFERENTIAL/PLATELET
Basophils Absolute: 0 10*3/uL (ref 0.0–0.1)
Basophils Relative: 0.4 % (ref 0.0–3.0)
Eosinophils Absolute: 0.1 10*3/uL (ref 0.0–0.7)
Eosinophils Relative: 1.3 % (ref 0.0–5.0)
HCT: 48.4 % (ref 39.0–52.0)
Hemoglobin: 16.3 g/dL (ref 13.0–17.0)
Lymphocytes Relative: 17.9 % (ref 12.0–46.0)
Lymphs Abs: 1.4 10*3/uL (ref 0.7–4.0)
MCHC: 33.7 g/dL (ref 30.0–36.0)
MCV: 98.5 fl (ref 78.0–100.0)
Monocytes Absolute: 0.7 10*3/uL (ref 0.1–1.0)
Monocytes Relative: 8.1 % (ref 3.0–12.0)
Neutro Abs: 5.8 10*3/uL (ref 1.4–7.7)
Neutrophils Relative %: 72.3 % (ref 43.0–77.0)
Platelets: 330 10*3/uL (ref 150.0–400.0)
RBC: 4.91 Mil/uL (ref 4.22–5.81)
RDW: 12.2 % (ref 11.5–14.6)
WBC: 8 10*3/uL (ref 4.5–10.5)

## 2013-06-19 LAB — LIPID PANEL
Cholesterol: 125 mg/dL (ref 0–200)
HDL: 37.6 mg/dL — ABNORMAL LOW
LDL Cholesterol: 66 mg/dL (ref 0–99)
Total CHOL/HDL Ratio: 3
Triglycerides: 105 mg/dL (ref 0.0–149.0)
VLDL: 21 mg/dL (ref 0.0–40.0)

## 2013-06-19 LAB — TSH: TSH: 2.66 u[IU]/mL (ref 0.35–5.50)

## 2013-06-19 NOTE — Telephone Encounter (Signed)
Pt aware per Padonda, STD tests can be done at [facility] on 07/02/13

## 2013-06-19 NOTE — Telephone Encounter (Signed)
Pt called to ask if he could have std labs done, or can they do from his lab work that he had drawn this am?

## 2013-07-02 ENCOUNTER — Other Ambulatory Visit: Payer: Self-pay

## 2013-07-02 ENCOUNTER — Encounter: Payer: Self-pay | Admitting: Family

## 2013-07-02 ENCOUNTER — Ambulatory Visit: Payer: Self-pay

## 2013-07-02 ENCOUNTER — Ambulatory Visit (INDEPENDENT_AMBULATORY_CARE_PROVIDER_SITE_OTHER): Payer: BC Managed Care – PPO | Admitting: Family

## 2013-07-02 VITALS — BP 110/80 | HR 90 | Temp 98.5°F | Ht 66.0 in | Wt 153.0 lb

## 2013-07-02 DIAGNOSIS — Z113 Encounter for screening for infections with a predominantly sexual mode of transmission: Secondary | ICD-10-CM

## 2013-07-02 DIAGNOSIS — Z0000 Encounter for general adult medical examination without abnormal findings: Secondary | ICD-10-CM

## 2013-07-02 DIAGNOSIS — Z23 Encounter for immunization: Secondary | ICD-10-CM

## 2013-07-02 NOTE — Progress Notes (Signed)
Pre visit review using our clinic review tool, if applicable. No additional management support is needed unless otherwise documented below in the visit note. 

## 2013-07-02 NOTE — Progress Notes (Signed)
   Subjective:    [name], male    DOB: 03/09/1971, 43 y.o.   MRN: [id number]  HPI 43 year old caucasian male, nonsmoker  presents for physical examination. Age and diagnosis  appropriate screening labs were reviewed. His immunization history was reviewed and appropriate vaccinations were ordered. His current medications and allergies were reviewed. He requests STD screening.    Review of Systems  Constitutional: Negative.   HENT: Negative.   Eyes: Negative.   Respiratory: Negative.   Cardiovascular: Negative.   Gastrointestinal: Negative.   Endocrine: Negative.   Genitourinary: Negative.   Musculoskeletal: Negative.   Skin: Negative.   Allergic/Immunologic: Negative.   Neurological: Negative.   Hematological: Negative.   Psychiatric/Behavioral: Negative.        Objective:   Physical Exam  Constitutional: He is oriented to person, place, and time. He appears well-developed and well-nourished.  HENT:  Head: Normocephalic.  Right Ear: External ear normal.  Left Ear: External ear normal.  Eyes: Conjunctivae are normal. Pupils are equal, round, and reactive to light.  Neck: Normal range of motion. Neck supple.  Cardiovascular: Normal rate, regular rhythm and normal heart sounds.   Pulmonary/Chest: Effort normal and breath sounds normal.  Abdominal: Soft. Bowel sounds are normal.  Genitourinary: Rectum normal and prostate normal. Guaiac negative stool.  Musculoskeletal: Normal range of motion.  Neurological: He is alert and oriented to person, place, and time.  Skin: Skin is warm and dry.  Psychiatric: He has a normal mood and affect. His behavior is normal. Judgment and thought content normal.  Tetanus and EKG was administered in office.       Assessment & Plan:  Assessment 1. Annual Physical Examination  Plan 1. Continue current medications.  2. Obtain labs that include STD screening and UA.  3.Encourage exercise. 4.Contact office for questions or  concerns.

## 2013-07-02 NOTE — Patient Instructions (Signed)
Exercise to Stay Healthy Exercise helps you become and stay healthy. EXERCISE IDEAS AND TIPS Choose exercises that:  You enjoy.  Fit into your day. You do not need to exercise really hard to be healthy. You can do exercises at a slow or medium level and stay healthy. You can:  Stretch before and after working out.  Try yoga, Pilates, or tai chi.  Lift weights.  Walk fast, swim, jog, run, climb stairs, bicycle, dance, or rollerskate.  Take aerobic classes. Exercises that burn about 150 calories:  Running 1  miles in 15 minutes.  Playing volleyball for 45 to 60 minutes.  Washing and waxing a car for 45 to 60 minutes.  Playing touch football for 45 minutes.  Walking 1  miles in 35 minutes.  Pushing a stroller 1  miles in 30 minutes.  Playing basketball for 30 minutes.  Raking leaves for 30 minutes.  Bicycling 5 miles in 30 minutes.  Walking 2 miles in 30 minutes.  Dancing for 30 minutes.  Shoveling snow for 15 minutes.  Swimming laps for 20 minutes.  Walking up stairs for 15 minutes.  Bicycling 4 miles in 15 minutes.  Gardening for 30 to 45 minutes.  Jumping rope for 15 minutes.  Washing windows or floors for 45 to 60 minutes. Document Released: 04/24/2010 Document Revised: 06/14/2011 Document Reviewed: 04/24/2010 ExitCare Patient Information 2014 [employer].  

## 2013-07-03 ENCOUNTER — Other Ambulatory Visit: Payer: Self-pay | Admitting: Family

## 2013-07-10 ENCOUNTER — Other Ambulatory Visit: Payer: Self-pay

## 2013-07-10 ENCOUNTER — Ambulatory Visit: Payer: Self-pay

## 2013-07-16 ENCOUNTER — Other Ambulatory Visit: Payer: Self-pay

## 2013-07-23 ENCOUNTER — Telehealth: Payer: Self-pay | Admitting: Family

## 2013-07-23 NOTE — Telephone Encounter (Signed)
Needs to have labs done from office visit. Schedule lab appt.

## 2013-07-24 NOTE — Telephone Encounter (Signed)
Spoke with pt about missed lab appointment. He states that he got sick and was not able to make it to the appointment. Past visits show that there have been a total of 9 canceled appointments (lab and [facility]) since pt established with Padonda on 06/05/2013. Advised pt to schedule a lab appointment that he is able to commit to soon. Pt verbalized understanding

## 2013-09-20 ENCOUNTER — Telehealth: Payer: Self-pay | Admitting: Family

## 2013-09-20 NOTE — Telephone Encounter (Signed)
Pt called to ask if the following meds can be increased  PROZAC, [facility]

## 2013-09-20 NOTE — Telephone Encounter (Signed)
Pt needs an appointment to discuss

## 2013-09-21 ENCOUNTER — Ambulatory Visit (INDEPENDENT_AMBULATORY_CARE_PROVIDER_SITE_OTHER): Payer: BC Managed Care – PPO | Admitting: Family

## 2013-09-21 ENCOUNTER — Encounter: Payer: Self-pay | Admitting: Family

## 2013-09-21 VITALS — BP 102/70 | HR 96 | Wt 154.0 lb

## 2013-09-21 DIAGNOSIS — F411 Generalized anxiety disorder: Secondary | ICD-10-CM

## 2013-09-21 MED ORDER — BUPROPION HCL ER (SR) 150 MG PO TB12: 150.0000 mg | ORAL_TABLET | Freq: Two times a day (BID) | ORAL | Status: DC

## 2013-09-21 MED ORDER — BUPROPION HCL ER (XL) 300 MG PO TB24: 300.0000 mg | ORAL_TABLET | Freq: Every day | ORAL | Status: DC

## 2013-09-21 MED ORDER — FLUOXETINE HCL 20 MG PO TABS: 30.0000 mg | ORAL_TABLET | Freq: Every day | ORAL | Status: DC

## 2013-09-21 NOTE — Progress Notes (Signed)
   Subjective:    [name], male    DOB: 09/08/1970, 43 y.o.   MRN: [id number]  HPI  43 yo Caucasian male presents for medication follow-up. He describes continued feelings of anxiousness and racing thoughts. He reports having stress due to full custody of daughter. He seeks guidance of a therapist with some benefit. He reports a desire to increase his exercise. Denies SI.    Review of Systems  Constitutional: Negative.   Respiratory: Negative.   Cardiovascular: Negative.   Endocrine: Negative.   Neurological: Negative.   Psychiatric/Behavioral: Positive for decreased concentration and agitation. Negative for suicidal ideas. The patient is nervous/anxious.    Past Medical History  Diagnosis Date  . Asperger's disorder   . Anxiety   . Alcohol abuse     History   Social History  . Marital Status: Single    Spouse Name: N/A    Number of Children: N/A  . Years of Education: N/A   Occupational History  . Not on file.   Social History Main Topics  . Smoking status: Former Smoker    Types: Cigarettes  . Smokeless tobacco: Former NeurosurgeonUser    Types: Chew  . Alcohol Use: No     Comment: dailly usage/ pt reports recovering (3 months clean)  . Drug Use: No     Comment: pt reports being clean 4-5 months from cocaine  . Sexual Activity: Not on file   Other Topics Concern  . Not on file   Social History Narrative  . No narrative on file    No past surgical history on file.  No family history on file.  No Known Allergies  Current Outpatient Prescriptions on File Prior to Visit  Medication Sig Dispense Refill  . fluticasone (FLONASE) 50 MCG/ACT nasal spray Place 2 sprays into both nostrils daily.      Marland Kitchen. loratadine (CLARITIN) 10 MG tablet Take 10 mg by mouth daily.      Marland Kitchen. zolpidem (AMBIEN) 10 MG tablet TAKE 1 TABLET BY MOUTH AT BEDTIME AS NEEDED FOR SLEEP  15 tablet  0   No current facility-administered medications on file prior to visit.    BP 102/70   Pulse 96  Wt 154 lb (69.854 kg)  SpO2 99%    Objective:   Physical Exam  Constitutional: He is oriented to person, place, and time. He appears well-developed and well-nourished.  Cardiovascular: Normal rate, regular rhythm and normal heart sounds.   No murmur heard. Pulmonary/Chest: Effort normal.  Musculoskeletal: Normal range of motion.  Neurological: He is alert and oriented to person, place, and time.  Skin: Skin is warm.  Psychiatric:  Appears restless and anxious.           Assessment & Plan:  Nathan Frey was seen today for discuss medication.  Diagnoses and associated orders for this visit:  Generalized anxiety disorder - Discontinue: buPROPion (WELLBUTRIN SR) 150 MG 12 hr tablet; Take 1 tablet (150 mg total) by mouth 2 (two) times daily.  Other Orders - FLUoxetine (PROZAC) 20 MG tablet; Take 1.5 tablets (30 mg total) by mouth daily. - buPROPion (WELLBUTRIN XL) 300 MG 24 hr tablet; Take 1 tablet (300 mg total) by mouth daily.   Increase Prozac to 30 mg and Wellbutrin to 300 mg. Recheck in 3 weeks.

## 2013-09-21 NOTE — Telephone Encounter (Signed)
Pt has been scheduled for appt °

## 2013-09-21 NOTE — Progress Notes (Signed)
Pre visit review using our clinic review tool, if applicable. No additional management support is needed unless otherwise documented below in the visit note. 

## 2013-09-28 ENCOUNTER — Other Ambulatory Visit: Payer: Self-pay | Admitting: Family

## 2013-09-28 ENCOUNTER — Other Ambulatory Visit: Payer: Self-pay

## 2013-10-12 ENCOUNTER — Ambulatory Visit: Payer: Self-pay | Admitting: Family

## 2013-10-12 DIAGNOSIS — Z0289 Encounter for other administrative examinations: Secondary | ICD-10-CM

## 2013-11-01 ENCOUNTER — Telehealth: Payer: Self-pay | Admitting: Family

## 2013-11-01 MED ORDER — ZOLPIDEM TARTRATE 10 MG PO TABS: ORAL_TABLET | ORAL | Status: DC

## 2013-11-01 NOTE — Telephone Encounter (Signed)
[id number] - [place], [place] - [address] is requesting re-fill on zolpidem (AMBIEN) 10 MG tablet °

## 2013-11-26 ENCOUNTER — Other Ambulatory Visit: Payer: Self-pay | Admitting: Family

## 2013-12-02 ENCOUNTER — Other Ambulatory Visit: Payer: Self-pay | Admitting: Family

## 2014-02-06 ENCOUNTER — Other Ambulatory Visit: Payer: Self-pay | Admitting: Family

## 2014-12-04 ENCOUNTER — Ambulatory Visit: Payer: Self-pay | Admitting: Internal Medicine

## 2014-12-04 ENCOUNTER — Encounter: Payer: Self-pay | Admitting: Family

## 2014-12-04 ENCOUNTER — Ambulatory Visit (INDEPENDENT_AMBULATORY_CARE_PROVIDER_SITE_OTHER): Payer: Self-pay | Admitting: Family

## 2014-12-04 ENCOUNTER — Telehealth: Payer: Self-pay | Admitting: *Deleted

## 2014-12-04 VITALS — BP 158/96 | HR 96 | Temp 98.3°F | Wt 151.0 lb

## 2014-12-04 DIAGNOSIS — F411 Generalized anxiety disorder: Secondary | ICD-10-CM

## 2014-12-04 DIAGNOSIS — N529 Male erectile dysfunction, unspecified: Secondary | ICD-10-CM

## 2014-12-04 DIAGNOSIS — F331 Major depressive disorder, recurrent, moderate: Secondary | ICD-10-CM

## 2014-12-04 LAB — TESTOSTERONE: Testosterone: 305.56 ng/dL (ref 300.00–890.00)

## 2014-12-04 MED ORDER — FLUOXETINE HCL 20 MG PO TABS: 30.0000 mg | ORAL_TABLET | Freq: Every day | ORAL | Status: DC

## 2014-12-04 MED ORDER — BUPROPION HCL ER (XL) 300 MG PO TB24: 300.0000 mg | ORAL_TABLET | Freq: Every day | ORAL | Status: DC

## 2014-12-04 NOTE — Progress Notes (Signed)
Pre visit review using our clinic review tool, if applicable. No additional management support is needed unless otherwise documented below in the visit note. 

## 2014-12-04 NOTE — Telephone Encounter (Signed)
Patient came into lab today after his appointment with provider. Patient was questioning the labs getting drawn. Patient refused getting blood work testing for drug screen. Patient agreed for testosterone but was adamant about not getting drug screen serum performed. Padonda aware. Only testosterone levels drawn.

## 2014-12-04 NOTE — Patient Instructions (Signed)
Erectile Dysfunction Erectile dysfunction is the inability to get or sustain a good enough erection to have sexual intercourse. Erectile dysfunction may involve:  Inability to get an erection.  Lack of enough hardness to allow penetration.  Loss of the erection before sex is finished.  Premature ejaculation. CAUSES  Certain drugs, such as:  Pain relievers.  Antihistamines.  Antidepressants.  Blood pressure medicines.  Water pills (diuretics).  Ulcer medicines.  Muscle relaxants.  Illegal drugs.  Excessive drinking.  Psychological causes, such as:  Anxiety.  Depression.  Sadness.  Exhaustion.  Performance fear.  Stress.  Physical causes, such as:  Artery problems. This may include diabetes, smoking, liver disease, or atherosclerosis.  High blood pressure.  Hormonal problems, such as low testosterone.  Obesity.  Nerve problems. This may include back or pelvic injuries, diabetes mellitus, multiple sclerosis, or Parkinson disease. SYMPTOMS  Inability to get an erection.  Lack of enough hardness to allow penetration.  Loss of the erection before sex is finished.  Premature ejaculation.  Normal erections at some times, but with frequent unsatisfactory episodes.  Orgasms that are not satisfactory in sensation or frequency.  Low sexual satisfaction in either partner because of erection problems.  A curved penis occurring with erection. The curve may cause pain or may be too curved to allow for intercourse.  Never having nighttime erections. DIAGNOSIS Your caregiver can often diagnose this condition by:  Performing a physical exam to find other diseases or specific problems with the penis.  Asking you detailed questions about the problem.  Performing blood tests to check for diabetes mellitus or to measure hormone levels.  Performing urine tests to find other underlying health conditions.  Performing an ultrasound exam to check for  scarring.  Performing a test to check blood flow to the penis.  Doing a sleep study at home to measure nighttime erections. TREATMENT   You may be prescribed medicines by mouth.  You may be given medicine injections into the penis.  You may be prescribed a vacuum pump with a ring.  Penile implant surgery may be performed. You may receive:  An inflatable implant.  A semirigid implant.  Blood vessel surgery may be performed. HOME CARE INSTRUCTIONS  If you are prescribed oral medicine, you should take the medicine as prescribed. Do not increase the dosage without first discussing it with your physician.  If you are using self-injections, be careful to avoid any veins that are on the surface of the penis. Apply pressure to the injection site for 5 minutes.  If you are using a vacuum pump, make sure you have read the instructions before using it. Discuss any questions with your physician before taking the pump home. SEEK MEDICAL CARE IF:  You experience pain that is not responsive to the pain medicine you have been prescribed.  You experience nausea or vomiting. SEEK IMMEDIATE MEDICAL CARE IF:   When taking oral or injectable medications, you experience an erection that lasts longer than 4 hours. If your physician is unavailable, go to the nearest emergency room for evaluation. An erection that lasts much longer than 4 hours can result in permanent damage to your penis.  You have pain that is severe.  You develop redness, severe pain, or severe swelling of your penis.  You have redness spreading up into your groin or lower abdomen.  You are unable to pass your urine. Document Released: 03/19/2000 Document Revised: 11/22/2012 Document Reviewed: 08/24/2012 [employer] Patient Information 2015 [employer]. This information is not   intended to replace advice given to you by your health care provider. Make sure you discuss any questions you have with your health care  provider. Major Depressive Disorder Major depressive disorder is a mental illness. It also may be called clinical depression or unipolar depression. Major depressive disorder usually causes feelings of sadness, hopelessness, or helplessness. Some people with this disorder do not feel particularly sad but lose interest in doing things they used to enjoy (anhedonia). Major depressive disorder also can cause physical symptoms. It can interfere with work, school, relationships, and other normal everyday activities. The disorder varies in severity but is longer lasting and more serious than the sadness we all feel from time to time in our lives. Major depressive disorder often is triggered by stressful life events or major life changes. Examples of these triggers include divorce, loss of your job or home, a move, and the death of a family member or close friend. Sometimes this disorder occurs for no obvious reason at all. People who have family members with major depressive disorder or bipolar disorder are at higher risk for developing this disorder, with or without life stressors. Major depressive disorder can occur at any age. It may occur just once in your life (single episode major depressive disorder). It may occur multiple times (recurrent major depressive disorder). SYMPTOMS People with major depressive disorder have either anhedonia or depressed mood on nearly a daily basis for at least 2 weeks or longer. Symptoms of depressed mood include:  Feelings of sadness (blue or down in the dumps) or emptiness.  Feelings of hopelessness or helplessness.  Tearfulness or episodes of crying (may be observed by others).  Irritability (children and adolescents). In addition to depressed mood or anhedonia or both, people with this disorder have at least four of the following symptoms:  Difficulty sleeping or sleeping too much.   Significant change (increase or decrease) in appetite or weight.   Lack of  energy or motivation.  Feelings of guilt and worthlessness.   Difficulty concentrating, remembering, or making decisions.  Unusually slow movement (psychomotor retardation) or restlessness (as observed by others).   Recurrent wishes for death, recurrent thoughts of self-harm (suicide), or a suicide attempt. People with major depressive disorder commonly have persistent negative thoughts about themselves, other people, and the world. People with severe major depressive disorder may experiencedistorted beliefs or perceptions about the world (psychotic delusions). They also may see or hear things that are not real (psychotic hallucinations). DIAGNOSIS Major depressive disorder is diagnosed through an assessment by your health care provider. Your health care provider will ask aboutaspects of your daily life, such as mood,sleep, and appetite, to see if you have the diagnostic symptoms of major depressive disorder. Your health care provider may ask about your medical history and use of alcohol or drugs, including prescription medicines. Your health care provider also may do a physical exam and blood work. This is because certain medical conditions and the use of certain substances can cause major depressive disorder-like symptoms (secondary depression). Your health care provider also may refer you to a mental health specialist for further evaluation and treatment. TREATMENT It is important to recognize the symptoms of major depressive disorder and seek treatment. The following treatments can be prescribed for this disorder:   Medicine. Antidepressant medicines usually are prescribed. Antidepressant medicines are thought to correct chemical imbalances in the brain that are commonly associated with major depressive disorder. Other types of medicine may be added if the symptoms do not respond to  antidepressant medicines alone or if psychotic delusions or hallucinations occur.  Talk therapy. Talk  therapy can be helpful in treating major depressive disorder by providing support, education, and guidance. Certain types of talk therapy also can help with negative thinking (cognitive behavioral therapy) and with relationship issues that trigger this disorder (interpersonal therapy). A mental health specialist can help determine which treatment is best for you. Most people with major depressive disorder do well with a combination of medicine and talk therapy. Treatments involving electrical stimulation of the brain can be used in situations with extremely severe symptoms or when medicine and talk therapy do not work over time. These treatments include electroconvulsive therapy, transcranial magnetic stimulation, and vagal nerve stimulation. Document Released: 07/17/2012 Document Revised: 08/06/2013 Document Reviewed: 07/17/2012 [facility] Patient Information 2015 [place], [place]. This information is not intended to replace advice given to you by your health care provider. Make sure you discuss any questions you have with your health care provider.

## 2014-12-04 NOTE — Progress Notes (Signed)
Subjective:    Patient ID: COLTYN HANNING, male    DOB: 1970-05-04, 44 y.o.   MRN: [id number]  HPI 44 year old Caucasian male seen today for severe anxiety and medication refill request.  Past medical history includes substance abuse, major depressive disorder, generalized anxiety, and alcohol dependence.  Patient reports severe anxiety, admits to stopping all medications over five months ago.  He states he is raising his child alone and recently lost his company.  He denies recent use of alcohol or drugs. However admits to taking a family members trazodone.  He requested a prescription for Xanax and states "its the only medication that works and its easy to purchase on the street".  He also reports new onset erectile dysfunction and request medication for this condition.   Review of Systems  Constitutional: Negative.   HENT: Negative.   Eyes: Negative.   Respiratory: Negative.   Cardiovascular: Negative.   Gastrointestinal: Negative.   Endocrine: Negative.   Genitourinary: Negative.   Musculoskeletal: Negative.   Skin: Negative.   Allergic/Immunologic: Negative.   Neurological: Negative.   Hematological: Negative.   Psychiatric/Behavioral: Positive for sleep disturbance. Negative for suicidal ideas, hallucinations, behavioral problems, confusion, self-injury, dysphoric mood, decreased concentration and agitation. The patient is nervous/anxious. The patient is not hyperactive.      . Past Medical History  Diagnosis Date  . Asperger's disorder   . Anxiety   . Alcohol abuse     Social History   Social History  . Marital Status: Single    Spouse Name: N/A  . Number of Children: N/A  . Years of Education: N/A   Occupational History  . Not on file.   Social History Main Topics  . Smoking status: Former Smoker    Types: Cigarettes  . Smokeless tobacco: Former Neurosurgeon    Types: Chew  . Alcohol Use: No     Comment: dailly usage/ pt reports recovering (3 months clean)  . Drug  Use: No     Comment: pt reports being clean 4-5 months from cocaine  . Sexual Activity: Not on file   Other Topics Concern  . Not on file   Social History Narrative    History reviewed. No pertinent past surgical history.  History reviewed. No pertinent family history.  No Known Allergies  Current Outpatient Prescriptions on File Prior to Visit  Medication Sig Dispense Refill  . busPIRone (BUSPAR) 15 MG tablet     . busPIRone (BUSPAR) 15 MG tablet TAKE 1 TABLET BY MOUTH 3 TIMES A DAY 90 tablet 3  . fluticasone (FLONASE) 50 MCG/ACT nasal spray Place 2 sprays into both nostrils daily.    Marland Kitchen loratadine (CLARITIN) 10 MG tablet Take 10 mg by mouth daily.    Marland Kitchen zolpidem (AMBIEN) 10 MG tablet TAKE 1 TABLET BY MOUTH AT BEDTIME AS NEEDED FOR SLEEP 15 tablet 2   No current facility-administered medications on file prior to visit.    BP 158/96 mmHg  Pulse 96  Temp(Src) 98.3 F (36.8 C) (Oral)  Wt 151 lb (68.493 kg)  SpO2 98%chart Objective:   Physical Exam  Constitutional: He is oriented to person, place, and time. He appears well-developed and well-nourished.  HENT:  Head: Normocephalic and atraumatic.  Right Ear: External ear normal.  Left Ear: External ear normal.  Nose: Nose normal.  Mouth/Throat: Oropharynx is clear and moist.  Eyes: Conjunctivae and EOM are normal. Pupils are equal, round, and reactive to light.  Neck: Normal range of motion.  Neck supple.  Cardiovascular: Normal rate, regular rhythm, normal heart sounds and intact distal pulses.   Pulmonary/Chest: Effort normal and breath sounds normal.  Abdominal: Soft. Bowel sounds are normal.  Musculoskeletal: Normal range of motion.  Neurological: He is alert and oriented to person, place, and time.  Skin: Skin is warm and dry.  Psychiatric: His behavior is normal. Judgment and thought content normal.  Patient appears anxious and agitated. Denies thoughts of suicide or intentions to harm others.            Assessment & Plan:  .Davieon was seen today for follow-up.  Diagnoses and all orders for this visit:  Major depressive disorder, recurrent episode, moderate -     Drug screen panel (serum)  Generalized anxiety disorder -     Drug screen panel (serum)  Erectile dysfunction, unspecified erectile dysfunction type -     Testosterone  Other orders -     buPROPion (WELLBUTRIN XL) 300 MG 24 hr tablet; Take 1 tablet (300 mg total) by mouth daily. -     FLUoxetine (PROZAC) 20 MG tablet; Take 1.5 tablets (30 mg total) by mouth daily.  Patient refused drug screen panel.  Ordered drug screen due to statements made during visit. Mr. Remo is very strange today, outside of his warm typically pretty levelheaded and makes things. Today, scattered thoughts, flight of ideas. Not really sure what other substances he's taking. Counseled patient regarding scheduling an appointment with psychiatrist for further evaluation.  Patient refused.  Patient is to follow-up as needed. Will follow-up with lab results.

## 2014-12-10 ENCOUNTER — Other Ambulatory Visit: Payer: Self-pay | Admitting: Family

## 2014-12-10 DIAGNOSIS — N529 Male erectile dysfunction, unspecified: Secondary | ICD-10-CM

## 2014-12-12 ENCOUNTER — Other Ambulatory Visit: Payer: Self-pay | Admitting: Family

## 2016-12-12 ENCOUNTER — Encounter (HOSPITAL_COMMUNITY): Payer: Self-pay | Admitting: *Deleted

## 2016-12-12 ENCOUNTER — Inpatient Hospital Stay (HOSPITAL_COMMUNITY)
Admission: EM | Admit: 2016-12-12 | Discharge: 2016-12-17 | DRG: 897 | Disposition: A | Discharge status: Home / Self Care | Payer: Self-pay | Attending: Internal Medicine | Admitting: Internal Medicine

## 2016-12-12 DIAGNOSIS — R059 Cough, unspecified: Secondary | ICD-10-CM

## 2016-12-12 DIAGNOSIS — Z87891 Personal history of nicotine dependence: Secondary | ICD-10-CM

## 2016-12-12 DIAGNOSIS — R451 Restlessness and agitation: Secondary | ICD-10-CM | POA: Diagnosis present

## 2016-12-12 DIAGNOSIS — F10239 Alcohol dependence with withdrawal, unspecified: Principal | ICD-10-CM | POA: Diagnosis present

## 2016-12-12 DIAGNOSIS — F1410 Cocaine abuse, uncomplicated: Secondary | ICD-10-CM | POA: Diagnosis present

## 2016-12-12 DIAGNOSIS — F845 Asperger's syndrome: Secondary | ICD-10-CM | POA: Diagnosis present

## 2016-12-12 DIAGNOSIS — R05 Cough: Secondary | ICD-10-CM

## 2016-12-12 DIAGNOSIS — F10939 Alcohol use, unspecified with withdrawal, unspecified: Secondary | ICD-10-CM | POA: Diagnosis present

## 2016-12-12 DIAGNOSIS — F411 Generalized anxiety disorder: Secondary | ICD-10-CM | POA: Diagnosis present

## 2016-12-12 LAB — COMPREHENSIVE METABOLIC PANEL
ALBUMIN: 4 g/dL (ref 3.5–5.0)
ALT: 40 U/L (ref 17–63)
AST: 34 U/L (ref 15–41)
Alkaline Phosphatase: 78 U/L (ref 38–126)
Anion gap: 8 (ref 5–15)
BILIRUBIN TOTAL: 0.5 mg/dL (ref 0.3–1.2)
BUN: 10 mg/dL (ref 6–20)
CALCIUM: 8.8 mg/dL — AB (ref 8.9–10.3)
CO2: 26 mmol/L (ref 22–32)
CREATININE: 0.85 mg/dL (ref 0.61–1.24)
Chloride: 105 mmol/L (ref 101–111)
GFR calc Af Amer: >60 mL/min (ref >60)
GFR calc non Af Amer: >60 mL/min (ref >60)
GLUCOSE: 97 mg/dL (ref 65–99)
Potassium: 3.7 mmol/L (ref 3.5–5.1)
Sodium: 139 mmol/L (ref 135–145)
TOTAL PROTEIN: 7.3 g/dL (ref 6.5–8.1)

## 2016-12-12 LAB — CBC WITH DIFFERENTIAL/PLATELET
BASOS PCT: 1 %
Basophils Absolute: 0.1 10*3/uL (ref 0.0–0.1)
Eosinophils Absolute: 0.4 10*3/uL (ref 0.0–0.7)
Eosinophils Relative: 4 %
HEMATOCRIT: 45 % (ref 39.0–52.0)
Hemoglobin: 15.4 g/dL (ref 13.0–17.0)
Lymphocytes Relative: 21 %
Lymphs Abs: 1.7 10*3/uL (ref 0.7–4.0)
MCH: 32.7 pg (ref 26.0–34.0)
MCHC: 34.2 g/dL (ref 30.0–36.0)
MCV: 95.5 fL (ref 78.0–100.0)
MONO ABS: 0.9 10*3/uL (ref 0.1–1.0)
MONOS PCT: 11 %
Neutro Abs: 5.2 10*3/uL (ref 1.7–7.7)
Neutrophils Relative %: 63 %
PLATELETS: 241 10*3/uL (ref 150–400)
RBC: 4.71 MIL/uL (ref 4.22–5.81)
RDW: 13.4 % (ref 11.5–15.5)
WBC: 8.2 10*3/uL (ref 4.0–10.5)

## 2016-12-12 LAB — URINALYSIS, ROUTINE W REFLEX MICROSCOPIC
BILIRUBIN URINE: NEGATIVE
Glucose, UA: NEGATIVE mg/dL
Hgb urine dipstick: NEGATIVE
Ketones, ur: NEGATIVE mg/dL
Leukocytes, UA: NEGATIVE
NITRITE: NEGATIVE
PH: 7 (ref 5.0–8.0)
Protein, ur: 30 mg/dL — AB
SPECIFIC GRAVITY, URINE: 1.027 (ref 1.005–1.030)

## 2016-12-12 LAB — RAPID URINE DRUG SCREEN, HOSP PERFORMED
Amphetamines: POSITIVE — AB
BARBITURATES: NOT DETECTED
BENZODIAZEPINES: POSITIVE — AB
Cocaine: POSITIVE — AB
Opiates: NOT DETECTED
Tetrahydrocannabinol: NOT DETECTED

## 2016-12-12 LAB — ETHANOL: Alcohol, Ethyl (B): <5 mg/dL (ref <5)

## 2016-12-12 LAB — MRSA PCR SCREENING: MRSA BY PCR: NEGATIVE

## 2016-12-12 LAB — I-STAT TROPONIN, ED: TROPONIN I, POC: 0 ng/mL (ref 0.00–0.08)

## 2016-12-12 MED ORDER — LORAZEPAM 2 MG/ML IJ SOLN
0.0000 mg | Freq: Four times a day (QID) | INTRAMUSCULAR | Status: AC
Start: 2016-12-12 — End: 2016-12-14
  Administered 2016-12-12 – 2016-12-13 (×4): 2 mg via INTRAVENOUS
  Filled 2016-12-12 (×3): qty 1

## 2016-12-12 MED ORDER — LORAZEPAM 2 MG/ML IJ SOLN
0.0000 mg | Freq: Two times a day (BID) | INTRAMUSCULAR | Status: AC
  Administered 2016-12-14 – 2016-12-15 (×2): 2 mg via INTRAVENOUS
  Filled 2016-12-12 (×3): qty 1

## 2016-12-12 MED ORDER — LORAZEPAM 2 MG/ML IJ SOLN
2.0000 mg | INTRAMUSCULAR | Status: DC | PRN
  Administered 2016-12-12 – 2016-12-14 (×10): 2 mg via INTRAVENOUS
  Administered 2016-12-14: 3 mg via INTRAVENOUS
  Administered 2016-12-14: 2 mg via INTRAVENOUS
  Administered 2016-12-14 – 2016-12-15 (×5): 3 mg via INTRAVENOUS
  Administered 2016-12-15 (×5): 2 mg via INTRAVENOUS
  Administered 2016-12-15: 3 mg via INTRAVENOUS
  Administered 2016-12-15 – 2016-12-16 (×5): 2 mg via INTRAVENOUS
  Filled 2016-12-12 (×2): qty 1
  Filled 2016-12-12: qty 2
  Filled 2016-12-12: qty 1
  Filled 2016-12-12 (×2): qty 2
  Filled 2016-12-12: qty 1
  Filled 2016-12-12: qty 2
  Filled 2016-12-12 (×4): qty 1
  Filled 2016-12-12: qty 2
  Filled 2016-12-12 (×8): qty 1
  Filled 2016-12-12 (×2): qty 2
  Filled 2016-12-12 (×5): qty 1
  Filled 2016-12-12: qty 2
  Filled 2016-12-12: qty 1

## 2016-12-12 MED ORDER — THIAMINE HCL 100 MG/ML IJ SOLN
100.0000 mg | Freq: Every day | INTRAMUSCULAR | Status: DC
  Administered 2016-12-12 – 2016-12-15 (×3): 100 mg via INTRAVENOUS
  Filled 2016-12-12 (×3): qty 2

## 2016-12-12 MED ORDER — THIAMINE HCL 100 MG/ML IJ SOLN
Freq: Once | INTRAVENOUS | Status: AC
  Administered 2016-12-12: 18:00:00 via INTRAVENOUS
  Filled 2016-12-12: qty 1000

## 2016-12-12 MED ORDER — ENOXAPARIN SODIUM 40 MG/0.4ML ~~LOC~~ SOLN: 40.0000 mg | SUBCUTANEOUS | Status: DC

## 2016-12-12 MED ORDER — LORAZEPAM 1 MG PO TABS
0.0000 mg | ORAL_TABLET | Freq: Two times a day (BID) | ORAL | Status: AC
Start: 2016-12-14 — End: 2016-12-16

## 2016-12-12 MED ORDER — LACTATED RINGERS IV BOLUS (SEPSIS)
1000.0000 mL | Freq: Once | INTRAVENOUS | Status: AC
  Administered 2016-12-12: 1000 mL via INTRAVENOUS

## 2016-12-12 MED ORDER — VITAMIN B-1 100 MG PO TABS
100.0000 mg | ORAL_TABLET | Freq: Every day | ORAL | Status: DC
  Administered 2016-12-13 – 2016-12-17 (×3): 100 mg via ORAL
  Filled 2016-12-12 (×4): qty 1

## 2016-12-12 MED ORDER — LORAZEPAM 1 MG PO TABS: 0.0000 mg | ORAL_TABLET | Freq: Four times a day (QID) | ORAL | Status: AC

## 2016-12-12 MED ORDER — SODIUM CHLORIDE 0.9 % IV SOLN
INTRAVENOUS | Status: DC
  Administered 2016-12-12 – 2016-12-14 (×4): via INTRAVENOUS

## 2016-12-12 NOTE — ED Notes (Signed)
RN has requested urine sample X2 Pt is very somnolent

## 2016-12-12 NOTE — ED Notes (Signed)
Pt has urinal and is aware of need for urine specimen  

## 2016-12-12 NOTE — ED Provider Notes (Signed)
[facility]-EMERGENCY DEPT Provider Note   CSN: [id number] Arrival date & time: 12/12/16  0741     History   Chief Complaint Chief Complaint  Patient presents with  . Alcohol Problem  . Drug Problem    HPI Nathan Frey is a 46 y.o. male.  The history is provided by the patient. No language interpreter was used.  Alcohol Problem   Drug Problem     Nathan Frey is a 46 y.o. male who presents to the Emergency Department complaining of detox.  He presents to the emergency department for drug and alcohol treatment. He drinks about a fifth of vodka daily and has been drinking since he was a teenager. His last drink was at 5 AM this morning. He desires to quit drinking. He states he cannot do that by himself because he has a history of severe alcohol withdrawal. He reports feeling anxious with diaphoresis and itching and crawling on his skin. He states he feels like he will begin of vomiting soon. He also uses crack cocaine, last use was around midnight. He denies any SI or HI. He is afraid he will die if he tries to quit drinking on his own.   Past Medical History:  Diagnosis Date  . Alcohol abuse   . Anxiety   . Asperger's disorder     Patient Active Problem List   Diagnosis Date Noted  . Substance abuse in remission 06/05/2013  . 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 Generalized anxiety disorder 02/18/2013  . Alcohol dependence (HCC) 02/18/2013    History reviewed. No pertinent surgical history.     Home Medications    Prior to Admission medications   Not on File    Family History No family history on file.  Social History Social History  Substance Use Topics  . Smoking status: Former Smoker    Types: Cigarettes  . Smokeless tobacco: Former Neurosurgeon    Types: Chew  . Alcohol use No     Comment: dailly usage/ pt reports recovering (3 months clean)     Allergies   Patient has no known allergies.   Review of  Systems Review of Systems  All other systems reviewed and are negative.    Physical Exam Updated Vital Signs BP 131/90 (BP Location: Left Arm)   Pulse 81   Temp 98 F (36.7 C) (Oral)   Resp 20   SpO2 99%   Physical Exam  Constitutional: He is oriented to person, place, and time. He appears well-developed and well-nourished.  HENT:  Head: Normocephalic and atraumatic.  Cardiovascular: Regular rhythm.   No murmur heard. tachycardic  Pulmonary/Chest: Effort normal and breath sounds normal. No respiratory distress.  Abdominal: Soft. There is no tenderness. There is no rebound and no guarding.  Musculoskeletal: He exhibits no edema or tenderness.  Neurological: He is alert and oriented to person, place, and time.  tremulous  Skin: Skin is warm and dry.  Psychiatric:  anxious  Nursing note and vitals reviewed.    ED Treatments / Results  Labs (all labs ordered are listed, but only abnormal results are displayed) Labs Reviewed  COMPREHENSIVE METABOLIC PANEL - Abnormal; Notable for the following:       Result Value   Calcium 8.8 (*)    All other components within normal limits  CBC WITH DIFFERENTIAL/PLATELET  ETHANOL  RAPID URINE DRUG SCREEN, HOSP PERFORMED  URINALYSIS, ROUTINE W REFLEX MICROSCOPIC  I-STAT TROPONIN, ED  EKG  EKG Interpretation  Date/Time:  Sunday December 12 2016 10:19:37 EDT Ventricular Rate:  90 PR Interval:    QRS Duration: 85 QT Interval:  385 QTC Calculation: 472 R Axis:   39 Text Interpretation:  Sinus rhythm ST elev, probable normal early repol pattern Confirmed by Tilden Fossaees, Johnatan Baskette [phone]([id number]) on 12/12/2016 10:25:49 AM       Radiology No results found.  Procedures Procedures (including critical care time)  Medications Ordered in ED Medications  LORazepam (ATIVAN) injection 0-4 mg (2 mg Intravenous Given 12/12/16 1028)    Or  LORazepam (ATIVAN) tablet 0-4 mg ( Oral See Alternative 12/12/16 1028)  LORazepam (ATIVAN) injection 0-4 mg  (not administered)    Or  LORazepam (ATIVAN) tablet 0-4 mg (not administered)  thiamine (VITAMIN B-1) tablet 100 mg ( Oral See Alternative 12/12/16 1029)    Or  thiamine (B-1) injection 100 mg (100 mg Intravenous Given 12/12/16 1029)  0.9 %  sodium chloride infusion ( Intravenous New Bag/Given 12/12/16 1547)  lactated ringers bolus 1,000 mL (0 mLs Intravenous Stopped 12/12/16 1323)     Initial Impression / Assessment and Plan / ED Course  I have reviewed the triage vital signs and the nursing notes.  Pertinent labs & imaging results that were available during my care of the patient were reviewed by me and considered in my medical decision making (see chart for details).     Patient with history of alcohol abuse as well as crack cocaine abusing her for detox. Patient mildly agitated on initial evaluation. On repeat assessment he is diaphoretic and confused. Concern for DTs and ciwa protocol initiated. Hospitalist consulted for admission for further treatment.  Final Clinical Impressions(s) / ED Diagnoses   Final diagnoses:  None    New Prescriptions New Prescriptions   No medications on file     Tilden Fossaees, Pearley Baranek, MD 12/12/16 [id number]

## 2016-12-12 NOTE — ED Notes (Signed)
Per MD order Pt is very somnolent, but when awakened is anxious and guarded and confused. MD verbally told nurse to hold off on more ativan

## 2016-12-12 NOTE — ED Notes (Signed)
Fall risk band place don patient.   

## 2016-12-12 NOTE — ED Notes (Signed)
Bed: [facility] Expected date:  Expected time:  Means of arrival:  Comments: 

## 2016-12-12 NOTE — H&P (Signed)
History and Physical    Nathan Frey:[id number] DOB: 03-10-1971 DOA: 12/12/2016  PCP: Eulis Foster, FNP  Patient coming from:   I have personally briefly reviewed patient's old medical records in [facility]  Chief Complaint: Alcohol withdrawal  HPI: Nathan Frey is a 46 y.o. male with medical history significant of alcohol abuse cocaine abuse came in requesting detox for alcohol and cocaine. history is obtained from the ER record. Patient reported using cocaine today at 12 AM and last alcohol drink was at 5 AM today. History is obtained from the records. When I saw him he was very somnolent after receiving Ativan. He reports drinking half fifth of vodka daily and has been drinking since he was a teenager. Basically he wants [place] to help him detox through by being in the hospital for cocaine and alcohol. He is afraid to detox himself face feels like he will die if he tries to detox himself alone.ED  Course: In the ED patient received Ativan. His sodium was 139 potassium 3.7 BUN 10 creatinine 0.85. His albumin level was 4.0 LFTs were unremarkable and normal. WBC count was 8.2 hemoglobin 15.4 platelet count 241. His alcohol level was less than 5 urine drug screen is still pending because the patient has not given any urine yet.  Review of Systems: As per HPI otherwise 10 point review of systems negative.  Past Medical History:  Diagnosis Date  . Alcohol abuse   . Anxiety   . Asperger's disorder     History reviewed. No pertinent surgical history.   reports that he has quit smoking. His smoking use included Cigarettes. He has quit using smokeless tobacco. His smokeless tobacco use included Chew. He reports that he does not drink alcohol or use drugs.  No Known Allergies  No family history on file. Unacceptable: Noncontributory, unremarkable, or negative. Acceptable: Family history reviewed and not pertinent (If you reviewed it)  Prior to Admission medications   Not on File      Physical Exam: Vitals:   12/12/16 1300 12/12/16 1324 12/12/16 1400 12/12/16 1526  BP: (!) 142/92 (!) 142/92 131/88 131/90  Pulse: 99 (!) 102 84 81  Resp: 19  (!) 23 20  Temp:      TempSrc:      SpO2: 96%  99% 99%    Constitutional: NAD, calm, comfortable Vitals:   12/12/16 1300 12/12/16 1324 12/12/16 1400 12/12/16 1526  BP: (!) 142/92 (!) 142/92 131/88 131/90  Pulse: 99 (!) 102 84 81  Resp: 19  (!) 23 20  Temp:      TempSrc:      SpO2: 96%  99% 99%   Eyes: PERRL, lids and conjunctivae normal ENMT: Mucous membranes are moist. Posterior pharynx clear of any exudate or lesions.Normal dentition.  Neck: normal, supple, no masses, no thyromegaly Respiratory: clear to auscultation bilaterally, no wheezing, no crackles. Normal respiratory effort. No accessory muscle use.  Cardiovascular: Regular rate and rhythm, no murmurs / rubs / gallops. No extremity edema. 2+ pedal pulses. No carotid bruits.  Abdomen: no tenderness, no masses palpated. No hepatosplenomegaly. Bowel sounds positive.  Musculoskeletal: no clubbing / cyanosis. No joint deformity upper and lower extremities. Good ROM, no contractures. Normal muscle tone.  Skin: no rashes, lesions, ulcers. No induration Neurologic: CN 2-12 grossly intact. Sensation intact, DTR normal. Strength 5/5 in all 4.  Psychiatric: Normal judgment and insight. Alert and oriented x 3. Normal mood.   (Anything < 9 systems with 2 bullets  each down codes to level 1) (If patient refuses exam can't bill higher level) (Make sure to document decubitus ulcers present on admission -- if possible -- and whether patient has chronic indwelling catheter at time of admission)  Labs on Admission: I have personally reviewed following labs and imaging studies  CBC:  Recent Labs Lab 12/12/16 1028  WBC 8.2  NEUTROABS 5.2  HGB 15.4  HCT 45.0  MCV 95.5  PLT 241   Basic Metabolic Panel:  Recent Labs Lab 12/12/16 1028  NA 139  K 3.7  CL 105  CO2 26   GLUCOSE 97  BUN 10  CREATININE 0.85  CALCIUM 8.8*   GFR: CrCl cannot be calculated (Unknown ideal weight.). Liver Function Tests:  Recent Labs Lab 12/12/16 1028  AST 34  ALT 40  ALKPHOS 78  BILITOT 0.5  PROT 7.3  ALBUMIN 4.0   No results for input(s): LIPASE, AMYLASE in the last 168 hours. No results for input(s): AMMONIA in the last 168 hours. Coagulation Profile: No results for input(s): INR, PROTIME in the last 168 hours. Cardiac Enzymes: No results for input(s): CKTOTAL, CKMB, CKMBINDEX, TROPONINI in the last 168 hours. BNP (last 3 results) No results for input(s): PROBNP in the last 8760 hours. HbA1C: No results for input(s): HGBA1C in the last 72 hours. CBG: No results for input(s): GLUCAP in the last 168 hours. Lipid Profile: No results for input(s): CHOL, HDL, LDLCALC, TRIG, CHOLHDL, LDLDIRECT in the last 72 hours. Thyroid Function Tests: No results for input(s): TSH, T4TOTAL, FREET4, T3FREE, THYROIDAB in the last 72 hours. Anemia Panel: No results for input(s): VITAMINB12, FOLATE, FERRITIN, TIBC, IRON, RETICCTPCT in the last 72 hours. Urine analysis:    Component Value Date/Time   BILIRUBINUR n 06/19/2013 1119   PROTEINUR trace 06/19/2013 1119   UROBILINOGEN 0.2 06/19/2013 1119   NITRITE n 06/19/2013 1119   LEUKOCYTESUR Negative 06/19/2013 1119    Radiological Exams on Admission: No results found.  EKG: Independently reviewed.   Assessment/Plan Active Problems:   Alcohol withdrawal (HCC)  Alcohol withdrawal cocaine abuse patient presented voluntarily to the ER for detox. He is placed on Cipro protocol with IV fluids and banana bag. He will be admitted to the sdu. Patient will benefit from a psych eval prior to dc.      DVT prophylaxis:  Code Status:  Family Communication:  Disposition Plan: Consults called Admission status:   Alwyn RenElizabeth G Mathews MD [facility] If 7PM-7AM, please contact night-coverage [email or web address] Password  [email or web address]  12/12/2016, 5:17 PM

## 2016-12-12 NOTE — ED Triage Notes (Signed)
Pt requesting detox for alcohol and cocaine. Pt states the last time he tried to quit alcohol he had skin itching and vomiting and would like to avoid those symptoms. Pt denies hx of withdrawal seizures. Pt's last drink was at [facility]5AM today, pt last used cocaine at 12AM today.

## 2016-12-13 ENCOUNTER — Inpatient Hospital Stay (HOSPITAL_COMMUNITY): Payer: Self-pay

## 2016-12-13 LAB — MAGNESIUM: MAGNESIUM: 1.8 mg/dL (ref 1.7–2.4)

## 2016-12-13 LAB — COMPREHENSIVE METABOLIC PANEL
ALK PHOS: 68 U/L (ref 38–126)
ALT: 32 U/L (ref 17–63)
AST: 30 U/L (ref 15–41)
Albumin: 3 g/dL — ABNORMAL LOW (ref 3.5–5.0)
Anion gap: 8 (ref 5–15)
BILIRUBIN TOTAL: 0.4 mg/dL (ref 0.3–1.2)
BUN: 7 mg/dL (ref 6–20)
CALCIUM: 8 mg/dL — AB (ref 8.9–10.3)
CO2: 22 mmol/L (ref 22–32)
CREATININE: 0.71 mg/dL (ref 0.61–1.24)
Chloride: 108 mmol/L (ref 101–111)
Glucose, Bld: 100 mg/dL — ABNORMAL HIGH (ref 65–99)
Potassium: 3.7 mmol/L (ref 3.5–5.1)
Sodium: 138 mmol/L (ref 135–145)
TOTAL PROTEIN: 5.7 g/dL — AB (ref 6.5–8.1)

## 2016-12-13 LAB — CBC
HCT: 42.1 % (ref 39.0–52.0)
Hemoglobin: 14.4 g/dL (ref 13.0–17.0)
MCH: 32.1 pg (ref 26.0–34.0)
MCHC: 34.2 g/dL (ref 30.0–36.0)
MCV: 93.8 fL (ref 78.0–100.0)
PLATELETS: 200 10*3/uL (ref 150–400)
RBC: 4.49 MIL/uL (ref 4.22–5.81)
RDW: 13.4 % (ref 11.5–15.5)
WBC: 6.9 10*3/uL (ref 4.0–10.5)

## 2016-12-13 LAB — PHOSPHORUS: Phosphorus: 3.6 mg/dL (ref 2.5–4.6)

## 2016-12-13 LAB — HIV ANTIBODY (ROUTINE TESTING W REFLEX): HIV Screen 4th Generation wRfx: NONREACTIVE

## 2016-12-13 MED ORDER — FLUOXETINE HCL 10 MG PO CAPS
10.0000 mg | ORAL_CAPSULE | Freq: Every day | ORAL | Status: DC
  Administered 2016-12-13 – 2016-12-17 (×5): 10 mg via ORAL
  Filled 2016-12-13 (×5): qty 1

## 2016-12-13 MED ORDER — BUPROPION HCL ER (XL) 150 MG PO TB24
150.0000 mg | ORAL_TABLET | Freq: Every day | ORAL | Status: DC
  Administered 2016-12-13 – 2016-12-17 (×5): 150 mg via ORAL
  Filled 2016-12-13 (×5): qty 1

## 2016-12-13 MED ORDER — ENOXAPARIN SODIUM 40 MG/0.4ML ~~LOC~~ SOLN
40.0000 mg | SUBCUTANEOUS | Status: DC
  Administered 2016-12-13 – 2016-12-14 (×2): 40 mg via SUBCUTANEOUS
  Filled 2016-12-13 (×2): qty 0.4

## 2016-12-13 MED ORDER — ENOXAPARIN SODIUM 40 MG/0.4ML ~~LOC~~ SOLN: 40.0000 mg | SUBCUTANEOUS | Status: DC

## 2016-12-13 NOTE — Progress Notes (Addendum)
PROGRESS NOTE  MURRAY DURRELL ZOX:[id number] DOB: Jan 11, 1971 DOA: 12/12/2016 PCP: Eulis Foster, FNP   LOS: 1 day   Brief Narrative / Interim history: Nathan Frey is a 46 y.o. male with medical history significant of alcohol abuse cocaine abuse came in requesting detox for alcohol and cocaine. Patient reports history of DTs with hallucinations while withdrawing in the past   Assessment & Plan: Active Problems:   Alcohol withdrawal (HCC)   Alcohol abuse -high risk for DTs, closely monitor in SDU this morning. May be OK for floor later today -continue CIWA -he used to be on Prozac and Wellbutrin in the past which helped him remain sober, ran out of these medications, will resume while here.   URI -patient feels like he is getting a "chest cold", today -obtain CXR   DVT prophylaxis: Lovenox Code Status: Full code Family Communication: no family at bedside Disposition Plan: home when ready  Consultants:   None   Procedures:   None   Antimicrobials:  None    Subjective: - no chest pain, shortness of breath, no abdominal pain, nausea or vomiting. Feels tremulous  Objective: Vitals:   12/13/16 0330 12/13/16 0400 12/13/16 0558 12/13/16 1000  BP:   (!) 150/95 (!) 137/112  Pulse:      Resp:  (!) 23 (!) 24 20  Temp: 98.1 F (36.7 C)     TempSrc: Oral     SpO2:  98% 96% 98%  Weight:      Height:        Intake/Output Summary (Last 24 hours) at 12/13/16 1017 Last data filed at 12/13/16 1000  Gross per 24 hour  Intake           2982.5 ml  Output              600 ml  Net           2382.5 ml   Filed Weights   12/12/16 1855  Weight: 77.6 kg (171 lb 1.2 oz)    Examination:  Vitals:   12/13/16 0330 12/13/16 0400 12/13/16 0558 12/13/16 1000  BP:   (!) 150/95 (!) 137/112  Pulse:      Resp:  (!) 23 (!) 24 20  Temp: 98.1 F (36.7 C)     TempSrc: Oral     SpO2:  98% 96% 98%  Weight:      Height:        Constitutional: NAD Eyes: lids and conjunctivae  normal ENMT: Mucous membranes are moist. No oropharyngeal exudates Respiratory: clear to auscultation bilaterally, no wheezing, no crackles. Normal respiratory effort. No accessory muscle use.  Cardiovascular: Regular rate and rhythm, no murmurs / rubs / gallops. No LE edema. 2+ pedal pulses. No carotid bruits.  Abdomen: no tenderness. Bowel sounds positive.  Skin: no rashes, lesions, ulcers. No induration Neurologic: non focal    Data Reviewed: I have independently reviewed following labs and imaging studies   CBC:  Recent Labs Lab 12/12/16 1028 12/13/16 0327  WBC 8.2 6.9  NEUTROABS 5.2  --   HGB 15.4 14.4  HCT 45.0 42.1  MCV 95.5 93.8  PLT 241 200   Basic Metabolic Panel:  Recent Labs Lab 12/12/16 1028 12/13/16 0327  NA 139 138  K 3.7 3.7  CL 105 108  CO2 26 22  GLUCOSE 97 100*  BUN 10 7  CREATININE 0.85 0.71  CALCIUM 8.8* 8.0*  MG  --  1.8  PHOS  --  3.6  GFR: Estimated Creatinine Clearance: 106.7 mL/min (by C-G formula based on SCr of 0.71 mg/dL). Liver Function Tests:  Recent Labs Lab 12/12/16 1028 12/13/16 0327  AST 34 30  ALT 40 32  ALKPHOS 78 68  BILITOT 0.5 0.4  PROT 7.3 5.7*  ALBUMIN 4.0 3.0*   No results for input(s): LIPASE, AMYLASE in the last 168 hours. No results for input(s): AMMONIA in the last 168 hours. Coagulation Profile: No results for input(s): INR, PROTIME in the last 168 hours. Cardiac Enzymes: No results for input(s): CKTOTAL, CKMB, CKMBINDEX, TROPONINI in the last 168 hours. BNP (last 3 results) No results for input(s): PROBNP in the last 8760 hours. HbA1C: No results for input(s): HGBA1C in the last 72 hours. CBG: No results for input(s): GLUCAP in the last 168 hours. Lipid Profile: No results for input(s): CHOL, HDL, LDLCALC, TRIG, CHOLHDL, LDLDIRECT in the last 72 hours. Thyroid Function Tests: No results for input(s): TSH, T4TOTAL, FREET4, T3FREE, THYROIDAB in the last 72 hours. Anemia Panel: No results for  input(s): VITAMINB12, FOLATE, FERRITIN, TIBC, IRON, RETICCTPCT in the last 72 hours. Urine analysis:    Component Value Date/Time   COLORURINE YELLOW 12/12/2016 0953   APPEARANCEUR HAZY (A) 12/12/2016 0953   LABSPEC 1.027 12/12/2016 0953   PHURINE 7.0 12/12/2016 0953   GLUCOSEU NEGATIVE 12/12/2016 0953   HGBUR NEGATIVE 12/12/2016 0953   BILIRUBINUR NEGATIVE 12/12/2016 0953   BILIRUBINUR n 06/19/2013 1119   KETONESUR NEGATIVE 12/12/2016 0953   PROTEINUR 30 (A) 12/12/2016 0953   UROBILINOGEN 0.2 06/19/2013 1119   NITRITE NEGATIVE 12/12/2016 0953   LEUKOCYTESUR NEGATIVE 12/12/2016 0953   Sepsis Labs: Invalid input(s): PROCALCITONIN, LACTICIDVEN  Recent Results (from the past 240 hour(s))  MRSA PCR Screening     Status: None   Collection Time: 12/12/16  6:51 PM  Result Value Ref Range Status   MRSA by PCR NEGATIVE NEGATIVE Final    Comment:        The GeneXpert MRSA Assay (FDA approved for NASAL specimens only), is one component of a comprehensive MRSA colonization surveillance program. It is not intended to diagnose MRSA infection nor to guide or monitor treatment for MRSA infections. Performed at [facility], [address], [place], [place] [address]       Radiology Studies: Dg Chest Port 1 View  Result Date: 12/13/2016 CLINICAL DATA:  Cough. EXAM: PORTABLE CHEST 1 VIEW COMPARISON:  None. FINDINGS: The heart size and mediastinal contours are within normal limits. Basilar opacity on the right likely represents atelectasis. There is no evidence of pulmonary edema, consolidation, pneumothorax, nodule or pleural fluid. The visualized skeletal structures are unremarkable. IMPRESSION: Right basilar atelectasis.  No active disease. Electronically Signed   By: Irish Lack M.D.   On: 12/13/2016 08:23     Scheduled Meds: . buPROPion  150 mg Oral Daily  . enoxaparin (LOVENOX) injection  40 mg Subcutaneous Q24H  . FLUoxetine  10 mg Oral Daily  . LORazepam  0-4 mg  Intravenous Q6H   Or  . LORazepam  0-4 mg Oral Q6H  . [START ON 12/14/2016] LORazepam  0-4 mg Intravenous Q12H   Or  . [START ON 12/14/2016] LORazepam  0-4 mg Oral Q12H  . thiamine  100 mg Oral Daily   Or  . thiamine  100 mg Intravenous Daily   Continuous Infusions: . sodium chloride 150 mL/hr at 12/13/16 0711     Pamella Pert, MD, PhD [facility] Pager [phone]  If 7PM-7AM, please contact  night-coverage [email or web address] Password [email or web address] 12/13/2016, 10:17 AM

## 2016-12-13 NOTE — Care Management Note (Signed)
Case Management Note  Patient Details  Name: Celso SickleMarc D Duzan MRN: [id number] Date of Birth: 05/20/1970  Subjective/Objective:      ETOH Abuse              Action/Plan: Date:  December 13, 2016 Chart reviewed for concurrent status and case management needs. Will continue to follow patient progress. Discharge Planning: following for needs Expected discharge date: [id number] Marcelle SmilingRhonda Davis, BSN, [place], [place]   [phone]  Expected Discharge Date:                  Expected Discharge Plan:  Home/Self Care  In-House Referral:  Clinical Social Work  Discharge planning Services  CM Consult  Post Acute Care Choice:    Choice offered to:     DME Arranged:    DME Agency:     HH Arranged:    HH Agency:     Status of Service:  In process, will continue to follow  If discussed at Long Length of Stay Meetings, dates discussed:    Additional Comments:  Golda AcreDavis, Rhonda Lynn, RN 12/13/2016, 8:50 AM

## 2016-12-14 LAB — CBC
HCT: 42 % (ref 39.0–52.0)
Hemoglobin: 14.4 g/dL (ref 13.0–17.0)
MCH: 32.3 pg (ref 26.0–34.0)
MCHC: 34.3 g/dL (ref 30.0–36.0)
MCV: 94.2 fL (ref 78.0–100.0)
PLATELETS: 204 10*3/uL (ref 150–400)
RBC: 4.46 MIL/uL (ref 4.22–5.81)
RDW: 13.3 % (ref 11.5–15.5)
WBC: 6.3 10*3/uL (ref 4.0–10.5)

## 2016-12-14 LAB — BASIC METABOLIC PANEL
Anion gap: 6 (ref 5–15)
BUN: 6 mg/dL (ref 6–20)
CALCIUM: 7.8 mg/dL — AB (ref 8.9–10.3)
CO2: 24 mmol/L (ref 22–32)
CREATININE: 0.75 mg/dL (ref 0.61–1.24)
Chloride: 106 mmol/L (ref 101–111)
Glucose, Bld: 107 mg/dL — ABNORMAL HIGH (ref 65–99)
Potassium: 3.3 mmol/L — ABNORMAL LOW (ref 3.5–5.1)
SODIUM: 136 mmol/L (ref 135–145)

## 2016-12-14 MED ORDER — POTASSIUM CHLORIDE CRYS ER 20 MEQ PO TBCR
40.0000 meq | EXTENDED_RELEASE_TABLET | Freq: Once | ORAL | Status: AC
  Administered 2016-12-14: 40 meq via ORAL
  Filled 2016-12-14: qty 2

## 2016-12-14 MED ORDER — DEXMEDETOMIDINE HCL IN NACL 200 MCG/50ML IV SOLN
0.4000 ug/kg/h | INTRAVENOUS | Status: DC
Start: 2016-12-14 — End: 2016-12-15
  Administered 2016-12-14: 0.4 ug/kg/h via INTRAVENOUS
  Administered 2016-12-15: 0.7 ug/kg/h via INTRAVENOUS
  Administered 2016-12-15: 1 ug/kg/h via INTRAVENOUS
  Filled 2016-12-14 (×3): qty 50

## 2016-12-14 NOTE — Progress Notes (Signed)
PROGRESS NOTE  Nathan Frey [id number] DOB: 01/31/1971 DOA: 12/12/2016 PCP: Eulis FosterWebb, Padonda B, FNP   LOS: 2 days   Brief Narrative / Interim history: Nathan SickleMarc D Wonder is a [age] y.o. male with medical history significant of alcohol abuse cocaine abuse came in requesting detox for alcohol and cocaine. Patient reports history of DTs with hallucinations while withdrawing in the past.  Patient has had significantly elevated CIWA scores, requiring ongoing stepdown monitoring.  He is slowly improving though.  Assessment & Plan: Active Problems:   Alcohol withdrawal (HCC)   Alcohol abuse -high risk for DTs, closely monitor in SDU for today as well. May be OK for floor Wednesday -continue CIWA, Continues to trigger -he used to be on Prozac and Wellbutrin in the past which helped him remain sober, ran out of these medications, will resume while here.   URI -patient felt like he is getting a "chest cold", chest x-ray negative   DVT prophylaxis: Lovenox Code Status: Full code Family Communication: no family at bedside Disposition Plan: home when ready  Consultants:   None   Procedures:   None   Antimicrobials:  None    Subjective: -Had a rough night, had weird dreams.  Asking for Ativan.  Objective: Vitals:   12/14/16 0500 12/14/16 0800 12/14/16 1156 12/14/16 1200  BP: (!) 138/96 (!) 153/101 (!) 150/100   Pulse: (!) 102 98    Resp: (!) 28 (!) 28 (!) 34   Temp:  (!) 97.2 F (36.2 C)  99.4 F (37.4 C)  TempSrc:  Axillary  Oral  SpO2: 96% 98% 97%   Weight:      Height:        Intake/Output Summary (Last 24 hours) at 12/14/16 1307 Last data filed at 12/14/16 1250  Gross per 24 hour  Intake             3550 ml  Output             2425 ml  Net             1125 ml   Filed Weights   12/12/16 1855  Weight: 77.6 kg (171 lb 1.2 oz)    Examination:  Vitals:   12/14/16 0500 12/14/16 0800 12/14/16 1156 12/14/16 1200  BP: (!) 138/96 (!) 153/101 (!) 150/100   Pulse: (!)  102 98    Resp: (!) 28 (!) 28 (!) 34   Temp:  (!) 97.2 F (36.2 C)  99.4 F (37.4 C)  TempSrc:  Axillary  Oral  SpO2: 96% 98% 97%   Weight:      Height:        Constitutional: NAD Respiratory: CTA Cardiovascular: RRR  Data Reviewed: I have independently reviewed following labs and imaging studies   CBC:  Recent Labs Lab 12/12/16 1028 12/13/16 0327 12/14/16 0326  WBC 8.2 6.9 6.3  NEUTROABS 5.2  --   --   HGB 15.4 14.4 14.4  HCT 45.0 42.1 42.0  MCV 95.5 93.8 94.2  PLT 241 200 204   Basic Metabolic Panel:  Recent Labs Lab 12/12/16 1028 12/13/16 0327 12/14/16 0326  NA 139 138 136  K 3.7 3.7 3.3*  CL 105 108 106  CO2 26 22 24   GLUCOSE 97 100* 107*  BUN 10 7 6   CREATININE 0.85 0.71 0.75  CALCIUM 8.8* 8.0* 7.8*  MG  --  1.8  --   PHOS  --  3.6  --    GFR: Estimated Creatinine Clearance: 106.7  mL/min (by C-G formula based on SCr of 0.75 mg/dL). Liver Function Tests:  Recent Labs Lab 12/12/16 1028 12/13/16 0327  AST 34 30  ALT 40 32  ALKPHOS 78 68  BILITOT 0.5 0.4  PROT 7.3 5.7*  ALBUMIN 4.0 3.0*   No results for input(s): LIPASE, AMYLASE in the last 168 hours. No results for input(s): AMMONIA in the last 168 hours. Coagulation Profile: No results for input(s): INR, PROTIME in the last 168 hours. Cardiac Enzymes: No results for input(s): CKTOTAL, CKMB, CKMBINDEX, TROPONINI in the last 168 hours. BNP (last 3 results) No results for input(s): PROBNP in the last 8760 hours. HbA1C: No results for input(s): HGBA1C in the last 72 hours. CBG: No results for input(s): GLUCAP in the last 168 hours. Lipid Profile: No results for input(s): CHOL, HDL, LDLCALC, TRIG, CHOLHDL, LDLDIRECT in the last 72 hours. Thyroid Function Tests: No results for input(s): TSH, T4TOTAL, FREET4, T3FREE, THYROIDAB in the last 72 hours. Anemia Panel: No results for input(s): VITAMINB12, FOLATE, FERRITIN, TIBC, IRON, RETICCTPCT in the last 72 hours. Urine analysis:      Component Value Date/Time   COLORURINE YELLOW 12/12/2016 0953   APPEARANCEUR HAZY (A) 12/12/2016 0953   LABSPEC 1.027 12/12/2016 0953   PHURINE 7.0 12/12/2016 0953   GLUCOSEU NEGATIVE 12/12/2016 0953   HGBUR NEGATIVE 12/12/2016 0953   BILIRUBINUR NEGATIVE 12/12/2016 0953   BILIRUBINUR n 06/19/2013 1119   KETONESUR NEGATIVE 12/12/2016 0953   PROTEINUR 30 (A) 12/12/2016 0953   UROBILINOGEN 0.2 06/19/2013 1119   NITRITE NEGATIVE 12/12/2016 0953   LEUKOCYTESUR NEGATIVE 12/12/2016 0953   Sepsis Labs: Invalid input(s): PROCALCITONIN, LACTICIDVEN  Recent Results (from the past 240 hour(s))  MRSA PCR Screening     Status: None   Collection Time: 12/12/16  6:51 PM  Result Value Ref Range Status   MRSA by PCR NEGATIVE NEGATIVE Final    Comment:        The GeneXpert MRSA Assay (FDA approved for NASAL specimens only), is one component of a comprehensive MRSA colonization surveillance program. It is not intended to diagnose MRSA infection nor to guide or monitor treatment for MRSA infections. Performed at [facility], [address], [place], [place] [address]       Radiology Studies: Dg Chest Port 1 View  Result Date: 12/13/2016 CLINICAL DATA:  Cough. EXAM: PORTABLE CHEST 1 VIEW COMPARISON:  None. FINDINGS: The heart size and mediastinal contours are within normal limits. Basilar opacity on the right likely represents atelectasis. There is no evidence of pulmonary edema, consolidation, pneumothorax, nodule or pleural fluid. The visualized skeletal structures are unremarkable. IMPRESSION: Right basilar atelectasis.  No active disease. Electronically Signed   By: Irish Lack M.D.   On: 12/13/2016 08:23     Scheduled Meds: . buPROPion  150 mg Oral Daily  . enoxaparin (LOVENOX) injection  40 mg Subcutaneous Q24H  . FLUoxetine  10 mg Oral Daily  . LORazepam  0-4 mg Intravenous Q12H   Or  . LORazepam  0-4 mg Oral Q12H  . thiamine  100 mg Oral Daily   Or  . thiamine   100 mg Intravenous Daily   Continuous Infusions:    Pamella Pert, MD, PhD [facility] Pager [phone]  If 7PM-7AM, please contact night-coverage [email or web address] Password [email or web address] 12/14/2016, 1:07 PM

## 2016-12-14 NOTE — Progress Notes (Signed)
Pt increasingly more agitated. Ativan being given q1h and patient still trying to get out of bed and saying inappropriate things to the RN. Dr. Karie Schwalbe. Opyd was notified that 10mg  of ativan had been given between 1923 and 2230. A precedex gtt was ordered and started @ 2300

## 2016-12-15 DIAGNOSIS — F10231 Alcohol dependence with withdrawal delirium: Secondary | ICD-10-CM

## 2016-12-15 LAB — BASIC METABOLIC PANEL
Anion gap: 8 (ref 5–15)
BUN: 7 mg/dL (ref 6–20)
CALCIUM: 8.5 mg/dL — AB (ref 8.9–10.3)
CHLORIDE: 105 mmol/L (ref 101–111)
CO2: 24 mmol/L (ref 22–32)
CREATININE: 0.71 mg/dL (ref 0.61–1.24)
Glucose, Bld: 105 mg/dL — ABNORMAL HIGH (ref 65–99)
Potassium: 3.6 mmol/L (ref 3.5–5.1)
SODIUM: 137 mmol/L (ref 135–145)

## 2016-12-15 MED ORDER — ZOLPIDEM TARTRATE 5 MG PO TABS
5.0000 mg | ORAL_TABLET | Freq: Every evening | ORAL | Status: DC | PRN
  Administered 2016-12-15 – 2016-12-16 (×2): 5 mg via ORAL
  Filled 2016-12-15 (×2): qty 1

## 2016-12-15 MED ORDER — HALOPERIDOL LACTATE 5 MG/ML IJ SOLN
2.0000 mg | Freq: Four times a day (QID) | INTRAMUSCULAR | Status: DC | PRN
  Administered 2016-12-15 – 2016-12-16 (×2): 2 mg via INTRAVENOUS
  Filled 2016-12-15 (×2): qty 1

## 2016-12-15 MED ORDER — PROMETHAZINE HCL 25 MG/ML IJ SOLN: 12.5000 mg | Freq: Four times a day (QID) | INTRAMUSCULAR | Status: DC | PRN

## 2016-12-15 MED ORDER — ONDANSETRON HCL 4 MG/2ML IJ SOLN: 4.0000 mg | Freq: Four times a day (QID) | INTRAMUSCULAR | Status: DC | PRN
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
## 2016-12-15 NOTE — Progress Notes (Signed)
[facility] PROGRESS NOTE    Progress Note  Rachel D Mandato  UEA:[id number] DOB: 08/23/1970 DOA: 12/12/2016 PCP: Eulis FosterWebb, PadonCelso Sickleda B, FNP     Brief Narrative:   Nathan SickleMarc D Frey is an 46 y.o. male past medical history significant for alcohol and cocaine abuse came in requesting detox and started having hallucinations with withdrawal symptoms. His CIWA score was elevated so he was admitted to stepdown.  Assessment/Plan:   Active Problems:   Alcohol withdrawal (HCC) in Stepdown, on Precedex due to agitation overnight. Thiamine and folate were given. His has been a 5-9. Vitals are stable. Continue Precedex drip, will allow diet. Titrate as tolerated Precedex drip.   DVT prophylaxis: SCD Family Communication:none Disposition Plan/Barrier to D/C: keep in SDU Code Status:     Code Status Orders        Start     Ordered   12/12/16 1715  Full code  Continuous     12/12/16 1716    Code Status History    Date Active Date Inactive Code Status Order ID Comments User Context   02/17/2013  6:19 PM 02/20/2013  5:17 PM Full Code [id number]  Earney Navynuoha, Josephine C, NP Inpatient   02/16/2013 10:35 AM 02/17/2013  6:19 PM Full Code [id number]  Ward GivensKnapp, Iva L, MD ED        IV Access:    Peripheral IV   Procedures and diagnostic studies:   Dg Chest Port 1 View  Result Date: 12/13/2016 CLINICAL DATA:  Cough. EXAM: PORTABLE CHEST 1 VIEW COMPARISON:  None. FINDINGS: The heart size and mediastinal contours are within normal limits. Basilar opacity on the right likely represents atelectasis. There is no evidence of pulmonary edema, consolidation, pneumothorax, nodule or pleural fluid. The visualized skeletal structures are unremarkable. IMPRESSION: Right basilar atelectasis.  No active disease. Electronically Signed   By: Irish LackGlenn  Yamagata M.D.   On: 12/13/2016 08:23     Medical Consultants:    None.  Anti-Infectives:    Subjective:    Nathan Frey sleeping comfortably in  bed.  Objective:    Vitals:   12/15/16 0200 12/15/16 0315 12/15/16 0400 12/15/16 0600  BP: 106/72  (!) 87/65 95/65  Pulse:    75  Resp: (!) 29  (!) 30 (!) 28  Temp:  97.6 F (36.4 C)    TempSrc:  Axillary    SpO2: 95%  98% 93%  Weight:      Height:        Intake/Output Summary (Last 24 hours) at 12/15/16 0724 Last data filed at 12/15/16 0600  Gross per 24 hour  Intake          1398.25 ml  Output             2875 ml  Net         -1476.75 ml   Filed Weights   12/12/16 1855  Weight: 77.6 kg (171 lb 1.2 oz)    Exam: General exam: in no acute distress Respiratory system: good air movement and clear to auscultation. Cardiovascular system: regular rate and rhythm with positive S1-S2 no murmurs rubs gallops. Gastrointestinal system: abdomen is soft nontender. Extremities: No pedal edema. Skin: No rashes, lesions or ulcers    Data Reviewed:    Labs: Basic Metabolic Panel:  Recent Labs Lab 12/12/16 1028 12/13/16 0327 12/14/16 0326 12/15/16 0322  NA 139 138 136 137  K 3.7 3.7 3.3* 3.6  CL 105 108 106 105  CO2 26 22 24 24   GLUCOSE  97 100* 107* 105*  BUN CREATININE 0.85 0.71 0.75 0.71  CALCIUM 8.8* 8.0* 7.8* 8.5*  MG  --  1.8  --   --   PHOS  --  3.6  --   --    GFR Estimated Creatinine Clearance: 106.7 mL/min (by C-G formula based on SCr of 0.71 mg/dL). Liver Function Tests:  Recent Labs Lab 12/12/16 1028 12/13/16 0327  AST 34 30  ALT 40 32  ALKPHOS 78 68  BILITOT 0.5 0.4  PROT 7.3 5.7*  ALBUMIN 4.0 3.0*   No results for input(s): LIPASE, AMYLASE in the last 168 hours. No results for input(s): AMMONIA in the last 168 hours. Coagulation profile No results for input(s): INR, PROTIME in the last 168 hours.  CBC:  Recent Labs Lab 12/12/16 1028 12/13/16 0327 12/14/16 0326  WBC 8.2 6.9 6.3  NEUTROABS 5.2  --   --   HGB 15.4 14.4 14.4  HCT 45.0 42.1 42.0  MCV 95.5 93.8 94.2  PLT 241 200 204   Cardiac Enzymes: No results for  input(s): CKTOTAL, CKMB, CKMBINDEX, TROPONINI in the last 168 hours. BNP (last 3 results) No results for input(s): PROBNP in the last 8760 hours. CBG: No results for input(s): GLUCAP in the last 168 hours. D-Dimer: No results for input(s): DDIMER in the last 72 hours. Hgb A1c: No results for input(s): HGBA1C in the last 72 hours. Lipid Profile: No results for input(s): CHOL, HDL, LDLCALC, TRIG, CHOLHDL, LDLDIRECT in the last 72 hours. Thyroid function studies: No results for input(s): TSH, T4TOTAL, T3FREE, THYROIDAB in the last 72 hours.  Invalid input(s): FREET3 Anemia work up: No results for input(s): VITAMINB12, FOLATE, FERRITIN, TIBC, IRON, RETICCTPCT in the last 72 hours. Sepsis Labs:  Recent Labs Lab 12/12/16 1028 12/13/16 0327 12/14/16 0326  WBC 8.2 6.9 6.3   Microbiology Recent Results (from the past 240 hour(s))  MRSA PCR Screening     Status: None   Collection Time: 12/12/16  6:51 PM  Result Value Ref Range Status   MRSA by PCR NEGATIVE NEGATIVE Final    Comment:        The GeneXpert MRSA Assay (FDA approved for NASAL specimens only), is one component of a comprehensive MRSA colonization surveillance program. It is not intended to diagnose MRSA infection nor to guide or monitor treatment for MRSA infections. Performed at [facility], [address] [place], [place] [address]      Medications:   . buPROPion  150 mg Oral Daily  . enoxaparin (LOVENOX) injection  40 mg Subcutaneous Q24H  . FLUoxetine  10 mg Oral Daily  . LORazepam  0-4 mg Intravenous Q12H   Or  . LORazepam  0-4 mg Oral Q12H  . thiamine  100 mg Oral Daily   Or  . thiamine  100 mg Intravenous Daily   Continuous Infusions: . dexmedetomidine (PRECEDEX) IV infusion 0.6 mcg/kg/hr (12/15/16 0427)     LOS: 3 days   Marinda Elk  [facility] Pager [phone]  *Please refer to [email or web address], password [email or web address] to get updated schedule on who will round on this patient, as  hospitalists switch teams weekly. If 7PM-7AM, please contact night-coverage at [email or web address], password [email or web address] for any overnight needs.  12/15/2016, 7:24 AM

## 2016-12-16 NOTE — Care Management Note (Signed)
Case Management Note  Patient Details  Name: Celso SickleMarc D Mula MRN: [id number] Date of Birth: 06/07/1970  Subjective/Objective:                  etoh and drug w/d with use of ativan  Action/Plan: Date:  December 16, 2016 Chart reviewed for concurrent status and case management needs. Will continue to follow patient progress. Discharge Planning: following for needs Expected discharge date: [id number] Marcelle SmilingRhonda Drishti Pepperman, BSN, [place]3, [place]   [phone]  Expected Discharge Date:   (unknown)               Expected Discharge Plan:  Home/Self Care  In-House Referral:  Clinical Social Work  Discharge planning Services  CM Consult  Post Acute Care Choice:    Choice offered to:     DME Arranged:    DME Agency:     HH Arranged:    HH Agency:     Status of Service:  In process, will continue to follow  If discussed at Long Length of Stay Meetings, dates discussed:    Additional Comments:  Golda AcreDavis, Brisia Schuermann Lynn, RN 12/16/2016, 8:30 AM

## 2016-12-16 NOTE — Progress Notes (Signed)
[facility] PROGRESS NOTE    Progress Note  Nathan Frey  MWU:[id number] DOB: 01/05/1971 DOA: 12/12/2016 PCP: Eulis Foster, FNP     Brief Narrative:   Nathan Frey is an 46 y.o. male past medical history significant for alcohol and cocaine abuse came in requesting detox and started having hallucinations with withdrawal symptoms. His CIWA score was elevated so he was admitted to stepdown.  Assessment/Plan:   Active Problems:   Alcohol withdrawal (HCC) Precedex, did have some agitation overnight resolved with Ativan. Thiamine and folate were given. His CIWA has been 5-9. Vitals are stable. Transfer to MedSurg continue Ativan protocol.   DVT prophylaxis: SCD Family Communication:none Disposition Plan/Barrier to D/C: keep in SDU Code Status:     Code Status Orders        Start     Ordered   12/12/16 1715  Full code  Continuous     12/12/16 1716    Code Status History    Date Active Date Inactive Code Status Order ID Comments User Context   02/17/2013  6:19 PM 02/20/2013  5:17 PM Full Code [id number]  Earney Navy, NP Inpatient   02/16/2013 10:35 AM 02/17/2013  6:19 PM Full Code [id number]  Ward Givens, MD ED        IV Access:    Peripheral IV   Procedures and diagnostic studies:   No results found.   Medical Consultants:    None.  Anti-Infectives:    Subjective:    JAKOBE Frey has no new complaints.  Objective:    Vitals:   12/16/16 0400 12/16/16 0500 12/16/16 0600 12/16/16 0700  BP: 127/89 127/85 (!) 123/91 122/87  Pulse:      Resp: (!) 3 (!) 29 (!) 29   Temp:      TempSrc:      SpO2: 91% 93% 93% 93%  Weight:      Height:        Intake/Output Summary (Last 24 hours) at 12/16/16 0726 Last data filed at 12/15/16 2000  Gross per 24 hour  Intake           281.59 ml  Output             1000 ml  Net          -718.41 ml   Filed Weights   12/12/16 1855  Weight: 77.6 kg (171 lb 1.2 oz)    Exam: General exam: in no  acute distress Respiratory system: good air movement and clear to auscultation. Cardiovascular system: regular rate and rhythm with positive S1-S2 no murmurs rubs gallops. Gastrointestinal system: abdomen is soft nontender. Extremities: No pedal edema. Skin: No rashes, lesions or ulcers    Data Reviewed:    Labs: Basic Metabolic Panel:  Recent Labs Lab 12/12/16 1028 12/13/16 0327 12/14/16 0326 12/15/16 0322  NA 139 138 136 137  K 3.7 3.7 3.3* 3.6  CL 105 108 106 105  CO2 GLUCOSE 97 100* 107* 105*  BUN CREATININE 0.85 0.71 0.75 0.71  CALCIUM 8.8* 8.0* 7.8* 8.5*  MG  --  1.8  --   --   PHOS  --  3.6  --   --    GFR Estimated Creatinine Clearance: 106.7 mL/min (by C-G formula based on SCr of 0.71 mg/dL). Liver Function Tests:  Recent Labs Lab 12/12/16 1028 12/13/16 0327  AST 34 30  ALT 40 32  ALKPHOS 78  68  BILITOT 0.5 0.4  PROT 7.3 5.7*  ALBUMIN 4.0 3.0*   No results for input(s): LIPASE, AMYLASE in the last 168 hours. No results for input(s): AMMONIA in the last 168 hours. Coagulation profile No results for input(s): INR, PROTIME in the last 168 hours.  CBC:  Recent Labs Lab 12/12/16 1028 12/13/16 0327 12/14/16 0326  WBC 8.2 6.9 6.3  NEUTROABS 5.2  --   --   HGB 15.4 14.4 14.4  HCT 45.0 42.1 42.0  MCV 95.5 93.8 94.2  PLT 241 200 204   Cardiac Enzymes: No results for input(s): CKTOTAL, CKMB, CKMBINDEX, TROPONINI in the last 168 hours. BNP (last 3 results) No results for input(s): PROBNP in the last 8760 hours. CBG: No results for input(s): GLUCAP in the last 168 hours. D-Dimer: No results for input(s): DDIMER in the last 72 hours. Hgb A1c: No results for input(s): HGBA1C in the last 72 hours. Lipid Profile: No results for input(s): CHOL, HDL, LDLCALC, TRIG, CHOLHDL, LDLDIRECT in the last 72 hours. Thyroid function studies: No results for input(s): TSH, T4TOTAL, T3FREE, THYROIDAB in the last 72 hours.  Invalid  input(s): FREET3 Anemia work up: No results for input(s): VITAMINB12, FOLATE, FERRITIN, TIBC, IRON, RETICCTPCT in the last 72 hours. Sepsis Labs:  Recent Labs Lab 12/12/16 1028 12/13/16 0327 12/14/16 0326  WBC 8.2 6.9 6.3   Microbiology Recent Results (from the past 240 hour(s))  MRSA PCR Screening     Status: None   Collection Time: 12/12/16  6:51 PM  Result Value Ref Range Status   MRSA by PCR NEGATIVE NEGATIVE Final    Comment:        The GeneXpert MRSA Assay (FDA approved for NASAL specimens only), is one component of a comprehensive MRSA colonization surveillance program. It is not intended to diagnose MRSA infection nor to guide or monitor treatment for MRSA infections. Performed at [facility], [address] [place], [place] [address]      Medications:   . buPROPion  150 mg Oral Daily  . FLUoxetine  10 mg Oral Daily  . LORazepam  0-4 mg Intravenous Q12H   Or  . LORazepam  0-4 mg Oral Q12H  . thiamine  100 mg Oral Daily   Or  . thiamine  100 mg Intravenous Daily   Continuous Infusions:    LOS: 4 days   Marinda ElkFELIZ ORTIZ, Lavida Patch  [facility] Pager [phone]  *Please refer to [email or web address], password [email or web address] to get updated schedule on who will round on this patient, as hospitalists switch teams weekly. If 7PM-7AM, please contact night-coverage at [email or web address], password [email or web address] for any overnight needs.  12/16/2016, 7:26 AM

## 2016-12-16 NOTE — Progress Notes (Signed)
Patient stated that he did not want Noralee Charsan Phelps to visit him.

## 2016-12-17 MED ORDER — BUPROPION HCL ER (XL) 150 MG PO TB24: 150.0000 mg | ORAL_TABLET | Freq: Every day | ORAL | 0 refills | Status: AC

## 2016-12-17 MED ORDER — FLUOXETINE HCL 10 MG PO CAPS: 10.0000 mg | ORAL_CAPSULE | Freq: Every day | ORAL | 0 refills | Status: AC

## 2016-12-17 NOTE — Discharge Summary (Signed)
Physician Discharge Summary  Nathan Frey ZOX:[id number] DOB: [birth year] DOA: 12/12/2016  PCP: Eulis Foster, FNP  Admit date: 12/12/2016 Discharge date: 12/17/2016  Admitted From: home Disposition:  Home  Recommendations for Outpatient Follow-up:  1. Follow up with PCP in 1-2 weeksCheck blood pressure and start antihypertensive medications as indicated. 2. Counseled about alcohol consumption.   Home Health:No Equipment/Devices:none  Discharge Condition:stable CODE STATUS:full Diet recommendation: Heart Healthy / Carb Modified / Regular / Dysphagia   Brief/Interim Summary: 46 y.o. male past medical history significant for alcohol and cocaine abuse came in requesting detox and started having hallucinations with withdrawal symptoms  Discharge Diagnoses:  Active Problems:   Alcohol withdrawal (HCC) Was admitted to the stepdown unit was started on Precedex, his symptoms started to improve, his electrolytes were repleted. He was changed to the Ativan protocol which she completed in house. He remained stable. He was counseled about alcohol consumption, and he seems to understand. He will follow-up with his primary care doctor as an outpatient.   Discharge Instructions  Discharge Instructions    Diet - low sodium heart healthy    Complete by:  As directed    Increase activity slowly    Complete by:  As directed      Allergies as of 12/17/2016   No Known Allergies     Medication List    TAKE these medications   buPROPion 150 MG 24 hr tablet Commonly known as:  WELLBUTRIN XL Take 1 tablet (150 mg total) by mouth daily.   FLUoxetine 10 MG capsule Commonly known as:  PROZAC Take 1 capsule (10 mg total) by mouth daily.            Discharge Care Instructions        Start     Ordered   12/17/16 0000  buPROPion (WELLBUTRIN XL) 150 MG 24 hr tablet  Daily     12/17/16 0748   12/17/16 0000  FLUoxetine (PROZAC) 10 MG capsule  Daily     12/17/16 0748   12/17/16 0000   Increase activity slowly     12/17/16 0748   12/17/16 0000  Diet - low sodium heart healthy     12/17/16 0748      No Known Allergies  Consultations:  None   Procedures/Studies: Dg Chest Port 1 View  Result Date: 12/13/2016 CLINICAL DATA:  Cough. EXAM: PORTABLE CHEST 1 VIEW COMPARISON:  None. FINDINGS: The heart size and mediastinal contours are within normal limits. Basilar opacity on the right likely represents atelectasis. There is no evidence of pulmonary edema, consolidation, pneumothorax, nodule or pleural fluid. The visualized skeletal structures are unremarkable. IMPRESSION: Right basilar atelectasis.  No active disease. Electronically Signed   By: Irish Lack M.D.   On: 12/13/2016 08:23     Subjective: No new complaints tolerating his diet.  Discharge Exam: Vitals:   12/16/16 2136 12/17/16 0436  BP: (!) 143/97 (!) 135/96  Pulse: 93 82  Resp: 18 20  Temp: 98.3 F (36.8 C) 98.1 F (36.7 C)  SpO2: 97% 97%   Vitals:   12/16/16 1300 12/16/16 1500 12/16/16 2136 12/17/16 0436  BP: 131/88 125/85 (!) 143/97 (!) 135/96  Pulse:  92 93 82  Resp: (!) 22 (!) 32 18 20  Temp:  97.7 F (36.5 C) 98.3 F (36.8 C) 98.1 F (36.7 C)  TempSrc:  Oral Oral Oral  SpO2: 94% 94% 97% 97%  Weight:      Height:  General: Pt is alert, awake, not in acute distress Cardiovascular: RRR, S1/S2 +, no rubs, no gallops Respiratory: CTA bilaterally, no wheezing, no rhonchi Abdominal: Soft, NT, ND, bowel sounds + Extremities: no edema, no cyanosis    The results of significant diagnostics from this hospitalization (including imaging, microbiology, ancillary and laboratory) are listed below for reference.     Microbiology: Recent Results (from the past 240 hour(s))  MRSA PCR Screening     Status: None   Collection Time: 12/12/16  6:51 PM  Result Value Ref Range Status   MRSA by PCR NEGATIVE NEGATIVE Final    Comment:        The GeneXpert MRSA Assay (FDA approved for  NASAL specimens only), is one component of a comprehensive MRSA colonization surveillance program. It is not intended to diagnose MRSA infection nor to guide or monitor treatment for MRSA infections. Performed at [facility], [address], [place], [place] [address]      Labs: BNP (last 3 results) No results for input(s): BNP in the last 8760 hours. Basic Metabolic Panel:  Recent Labs Lab 12/12/16 1028 12/13/16 0327 12/14/16 0326 12/15/16 0322  NA 139 138 136 137  K 3.7 3.7 3.3* 3.6  CL 105 108 106 105  CO2 GLUCOSE 97 100* 107* 105*  BUN CREATININE 0.85 0.71 0.75 0.71  CALCIUM 8.8* 8.0* 7.8* 8.5*  MG  --  1.8  --   --   PHOS  --  3.6  --   --    Liver Function Tests:  Recent Labs Lab 12/12/16 1028 12/13/16 0327  AST 34 30  ALT 40 32  ALKPHOS 78 68  BILITOT 0.5 0.4  PROT 7.3 5.7*  ALBUMIN 4.0 3.0*   No results for input(s): LIPASE, AMYLASE in the last 168 hours. No results for input(s): AMMONIA in the last 168 hours. CBC:  Recent Labs Lab 12/12/16 1028 12/13/16 0327 12/14/16 0326  WBC 8.2 6.9 6.3  NEUTROABS 5.2  --   --   HGB 15.4 14.4 14.4  HCT 45.0 42.1 42.0  MCV 95.5 93.8 94.2  PLT 241 200 204   Cardiac Enzymes: No results for input(s): CKTOTAL, CKMB, CKMBINDEX, TROPONINI in the last 168 hours. BNP: Invalid input(s): POCBNP CBG: No results for input(s): GLUCAP in the last 168 hours. D-Dimer No results for input(s): DDIMER in the last 72 hours. Hgb A1c No results for input(s): HGBA1C in the last 72 hours. Lipid Profile No results for input(s): CHOL, HDL, LDLCALC, TRIG, CHOLHDL, LDLDIRECT in the last 72 hours. Thyroid function studies No results for input(s): TSH, T4TOTAL, T3FREE, THYROIDAB in the last 72 hours.  Invalid input(s): FREET3 Anemia work up No results for input(s): VITAMINB12, FOLATE, FERRITIN, TIBC, IRON, RETICCTPCT in the last 72 hours. Urinalysis    Component Value Date/Time   COLORURINE  YELLOW 12/12/2016 0953   APPEARANCEUR HAZY (A) 12/12/2016 0953   LABSPEC 1.027 12/12/2016 0953   PHURINE 7.0 12/12/2016 0953   GLUCOSEU NEGATIVE 12/12/2016 0953   HGBUR NEGATIVE 12/12/2016 0953   BILIRUBINUR NEGATIVE 12/12/2016 0953   BILIRUBINUR n 06/19/2013 1119   KETONESUR NEGATIVE 12/12/2016 0953   PROTEINUR 30 (A) 12/12/2016 0953   UROBILINOGEN 0.2 06/19/2013 1119   NITRITE NEGATIVE 12/12/2016 0953   LEUKOCYTESUR NEGATIVE 12/12/2016 0953   Sepsis Labs Invalid input(s): PROCALCITONIN,  WBC,  LACTICIDVEN Microbiology Recent Results (from the past 240 hour(s))  MRSA PCR Screening  Status: None   Collection Time: 12/12/16  6:51 PM  Result Value Ref Range Status   MRSA by PCR NEGATIVE NEGATIVE Final    Comment:        The GeneXpert MRSA Assay (FDA approved for NASAL specimens only), is one component of a comprehensive MRSA colonization surveillance program. It is not intended to diagnose MRSA infection nor to guide or monitor treatment for MRSA infections. Performed at [facility], [address] [place], [place] [address]      Time coordinating discharge: Over 30 minutes  SIGNED:   Marinda Elk, MD  [facility] 12/17/2016, 7:48 AM Pager   If 7PM-7AM, please contact night-coverage [email or web address] Password [email or web address]

## 2016-12-17 NOTE — Progress Notes (Signed)
Patient was given discharge instructions. Patient was able to find his cell phone.

## 2017-01-28 ENCOUNTER — Encounter (HOSPITAL_COMMUNITY): Payer: Self-pay

## 2017-01-28 ENCOUNTER — Emergency Department (HOSPITAL_COMMUNITY)
Admission: EM | Admit: 2017-01-28 | Discharge: 2017-01-29 | Disposition: A | Discharge status: Home / Self Care | Payer: Self-pay | Attending: Emergency Medicine | Admitting: Emergency Medicine

## 2017-01-28 DIAGNOSIS — Z79899 Other long term (current) drug therapy: Secondary | ICD-10-CM | POA: Insufficient documentation

## 2017-01-28 DIAGNOSIS — Z87891 Personal history of nicotine dependence: Secondary | ICD-10-CM | POA: Insufficient documentation

## 2017-01-28 DIAGNOSIS — Z9114 Patient's other noncompliance with medication regimen: Secondary | ICD-10-CM | POA: Insufficient documentation

## 2017-01-28 DIAGNOSIS — R55 Syncope and collapse: Secondary | ICD-10-CM | POA: Insufficient documentation

## 2017-01-28 DIAGNOSIS — F10920 Alcohol use, unspecified with intoxication, uncomplicated: Secondary | ICD-10-CM | POA: Insufficient documentation

## 2017-01-28 DIAGNOSIS — F845 Asperger's syndrome: Secondary | ICD-10-CM | POA: Insufficient documentation

## 2017-01-28 LAB — CBC
HCT: 42.4 % (ref 39.0–52.0)
Hemoglobin: 14.4 g/dL (ref 13.0–17.0)
MCH: 32 pg (ref 26.0–34.0)
MCHC: 34 g/dL (ref 30.0–36.0)
MCV: 94.2 fL (ref 78.0–100.0)
Platelets: 196 10*3/uL (ref 150–400)
RBC: 4.5 MIL/uL (ref 4.22–5.81)
RDW: 13.8 % (ref 11.5–15.5)
WBC: 9.9 10*3/uL (ref 4.0–10.5)

## 2017-01-28 LAB — COMPREHENSIVE METABOLIC PANEL
ALT: 23 U/L (ref 17–63)
AST: 21 U/L (ref 15–41)
Albumin: 3.9 g/dL (ref 3.5–5.0)
Alkaline Phosphatase: 88 U/L (ref 38–126)
Anion gap: 12 (ref 5–15)
BUN: 12 mg/dL (ref 6–20)
CO2: 22 mmol/L (ref 22–32)
Calcium: 8.3 mg/dL — ABNORMAL LOW (ref 8.9–10.3)
Chloride: 104 mmol/L (ref 101–111)
Creatinine, Ser: 0.82 mg/dL (ref 0.61–1.24)
GFR calc Af Amer: >60 mL/min (ref >60)
GFR calc non Af Amer: >60 mL/min (ref >60)
Glucose, Bld: 100 mg/dL — ABNORMAL HIGH (ref 65–99)
Potassium: 3.4 mmol/L — ABNORMAL LOW (ref 3.5–5.1)
Sodium: 138 mmol/L (ref 135–145)
Total Bilirubin: 0.5 mg/dL (ref 0.3–1.2)
Total Protein: 7.3 g/dL (ref 6.5–8.1)

## 2017-01-28 LAB — CBG MONITORING, ED: Glucose-Capillary: 105 mg/dL — ABNORMAL HIGH (ref 65–99)

## 2017-01-28 MED ORDER — ONDANSETRON HCL 4 MG/2ML IJ SOLN
4.0000 mg | Freq: Once | INTRAMUSCULAR | Status: AC
Start: 2017-01-28 — End: 2017-01-28
  Administered 2017-01-28: 4 mg via INTRAVENOUS
  Filled 2017-01-28: qty 2

## 2017-01-28 MED ORDER — SODIUM CHLORIDE 0.9 % IV BOLUS (SEPSIS)
1000.0000 mL | Freq: Once | INTRAVENOUS | Status: AC
  Administered 2017-01-28: 1000 mL via INTRAVENOUS

## 2017-01-28 NOTE — ED Notes (Signed)
Pt unable to ambulate well at this time, gait and balance unsteady--- pt reports dizziness.  Was given sprite/fluids to drink and was advised to stay in the hall for the time being.

## 2017-01-28 NOTE — ED Notes (Signed)
Bed: [id number] Expected date:  Expected time:  Means of arrival:  Comments: EMS- 46yo M, AMS/possible cocaine use

## 2017-01-28 NOTE — ED Triage Notes (Signed)
Patient BIB EMS. Patient was found unresponsive in chair by friend. Patient was placed on the floor in left lateral recumbent position by friend and EMS called. Patient awakened to painful stimuli by EMS. Per report, patient bilateral pupils are pinpoint and patient is alert to self and time. Patient admits to EMS personell that he is a cocaine user and his last use was today. Per EMS report, the patient also had 2 empty bottles of hard liquor laying on the ground near where he was unresponsive. Patient EKG was unremarkable for EMS. Patient reports having Wellbutrin and Prozac prescribed for schizophrenia/depression,  But patient reports being non-compliant in taking them. Patient CBG for EMS = 174

## 2017-01-29 NOTE — ED Notes (Signed)
He ate all of his breakfast without difficulty and fell back into a deep slumber. I plan to allow him to rest until he awakens. His skin is normal, warm and dry and he is breathing normally.

## 2017-01-29 NOTE — ED Provider Notes (Signed)
[facility]-EMERGENCY DEPT Provider Note   CSN: [id number] Arrival date & time: 01/28/17  1355     History   Chief Complaint Chief Complaint  Patient presents with  . Altered Mental Status  . Loss of Consciousness    HPI Nathan Frey is a 46 y.o. male.  HPI   46yM with alcohol and drug abuse. Apparently found by friend poorly responsive in his residence. Pt admits to drinking heavily as he does on a regular basis. Asked about drug use and replied "of course." Says cocaine. Denies trying to harm self. Denies trauma. Psychiatric history and noncompliant with meds. Denies SI/HI or hallucinations.   Past Medical History:  Diagnosis Date  . Alcohol abuse   . Anxiety   . Asperger's disorder     Patient Active Problem List   Diagnosis Date Noted  . Alcohol withdrawal (HCC) 12/12/2016  . Substance abuse in remission (HCC) 06/05/2013  . 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 Generalized anxiety disorder 02/18/2013  . Alcohol dependence (HCC) 02/18/2013    History reviewed. No pertinent surgical history.     Home Medications    Prior to Admission medications   Medication Sig Start Date End Date Taking? Authorizing Provider  buPROPion (WELLBUTRIN XL) 150 MG 24 hr tablet Take 1 tablet (150 mg total) by mouth daily. 12/17/16  Yes Marinda Elk, MD  FLUoxetine (PROZAC) 10 MG capsule Take 1 capsule (10 mg total) by mouth daily. 12/17/16  Yes Marinda Elk, MD    Family History History reviewed. No pertinent family history.  Social History Social History  Substance Use Topics  . Smoking status: Former Smoker    Types: Cigarettes  . Smokeless tobacco: Former Neurosurgeon    Types: Chew  . Alcohol use No     Comment: dailly usage/ pt reports recovering (3 months clean)     Allergies   Patient has no known allergies.   Review of Systems Review of Systems  All systems reviewed and  negative, other than as noted in HPI.  Physical Exam Updated Vital Signs BP 122/83   Pulse 90   Temp 97.9 F (36.6 C) (Oral)   Resp 18   SpO2 100%   Physical Exam  Constitutional: He appears well-developed and well-nourished. No distress.  HENT:  Head: Normocephalic and atraumatic.  Eyes: Conjunctivae are normal. Right eye exhibits no discharge. Left eye exhibits no discharge.  Neck: Neck supple.  Cardiovascular: Normal rate, regular rhythm and normal heart sounds.  Exam reveals no gallop and no friction rub.   No murmur heard. Pulmonary/Chest: Effort normal and breath sounds normal. No respiratory distress.  Abdominal: Soft. He exhibits no distension. There is no tenderness.  Musculoskeletal: He exhibits no edema or tenderness.  Neurological: He is alert.  Skin: Skin is warm and dry.  Psychiatric: He has a normal mood and affect. His behavior is normal. Thought content normal.  General psychomotor slowing consistent with alcohol intoxication.   Nursing note and vitals reviewed.    ED Treatments / Results  Labs (all labs ordered are listed, but only abnormal results are displayed) Labs Reviewed  COMPREHENSIVE METABOLIC PANEL - Abnormal; Notable for the following:       Result Value   Potassium 3.4 (*)    Glucose, Bld 100 (*)    Calcium 8.3 (*)    All other components within normal limits  CBG MONITORING, ED - Abnormal; Notable for the following:  Glucose-Capillary 105 (*)    All other components within normal limits  CBC    EKG  EKG Interpretation  Date/Time:  Friday January 28 2017 14:14:32 EDT Ventricular Rate:  106 PR Interval:    QRS Duration: 83 QT Interval:  353 QTC Calculation: 467 R Axis:   47 Text Interpretation:  Sinus tachycardia Probable left atrial enlargement Baseline wander in lead(s) V3 When compared with ECG of 12/12/2016, No significant change was found Confirmed by Dione BoozeGlick, David ([id number]) on 01/28/2017 2:23:22 PM       Radiology No results  found.  Procedures Procedures (including critical care time)  Medications Ordered in ED Medications  sodium chloride 0.9 % bolus 1,000 mL (0 mLs Intravenous Stopped 01/28/17 2143)  ondansetron (ZOFRAN) injection 4 mg (4 mg Intravenous Given 01/28/17 1548)     Initial Impression / Assessment and Plan / ED Course  I have reviewed the triage vital signs and the nursing notes.  Pertinent labs & imaging results that were available during my care of the patient were reviewed by me and considered in my medical decision making (see chart for details).    46yM with etoh abuse. No SI. Not psychotic. Plan to observe until reasonably sober for DC.   Final Clinical Impressions(s) / ED Diagnoses   Final diagnoses:  Alcoholic intoxication without complication [facility](HCC)    New Prescriptions New Prescriptions   No medications on file     Raeford RazorKohut, Davaun Quintela, MD 02/08/17 1443

## 2017-01-29 NOTE — ED Notes (Signed)
He arouses easily and is given breakfast at this time. He is oriented x 4 with clear speech.

## 2018-10-18 IMAGING — DX DG CHEST 1V PORT
1 series · 1 of 1 positions shown · non-contrast
Comparison: None.

CLINICAL DATA: Cough.

EXAM:
PORTABLE CHEST 1 VIEW

[chest ap]
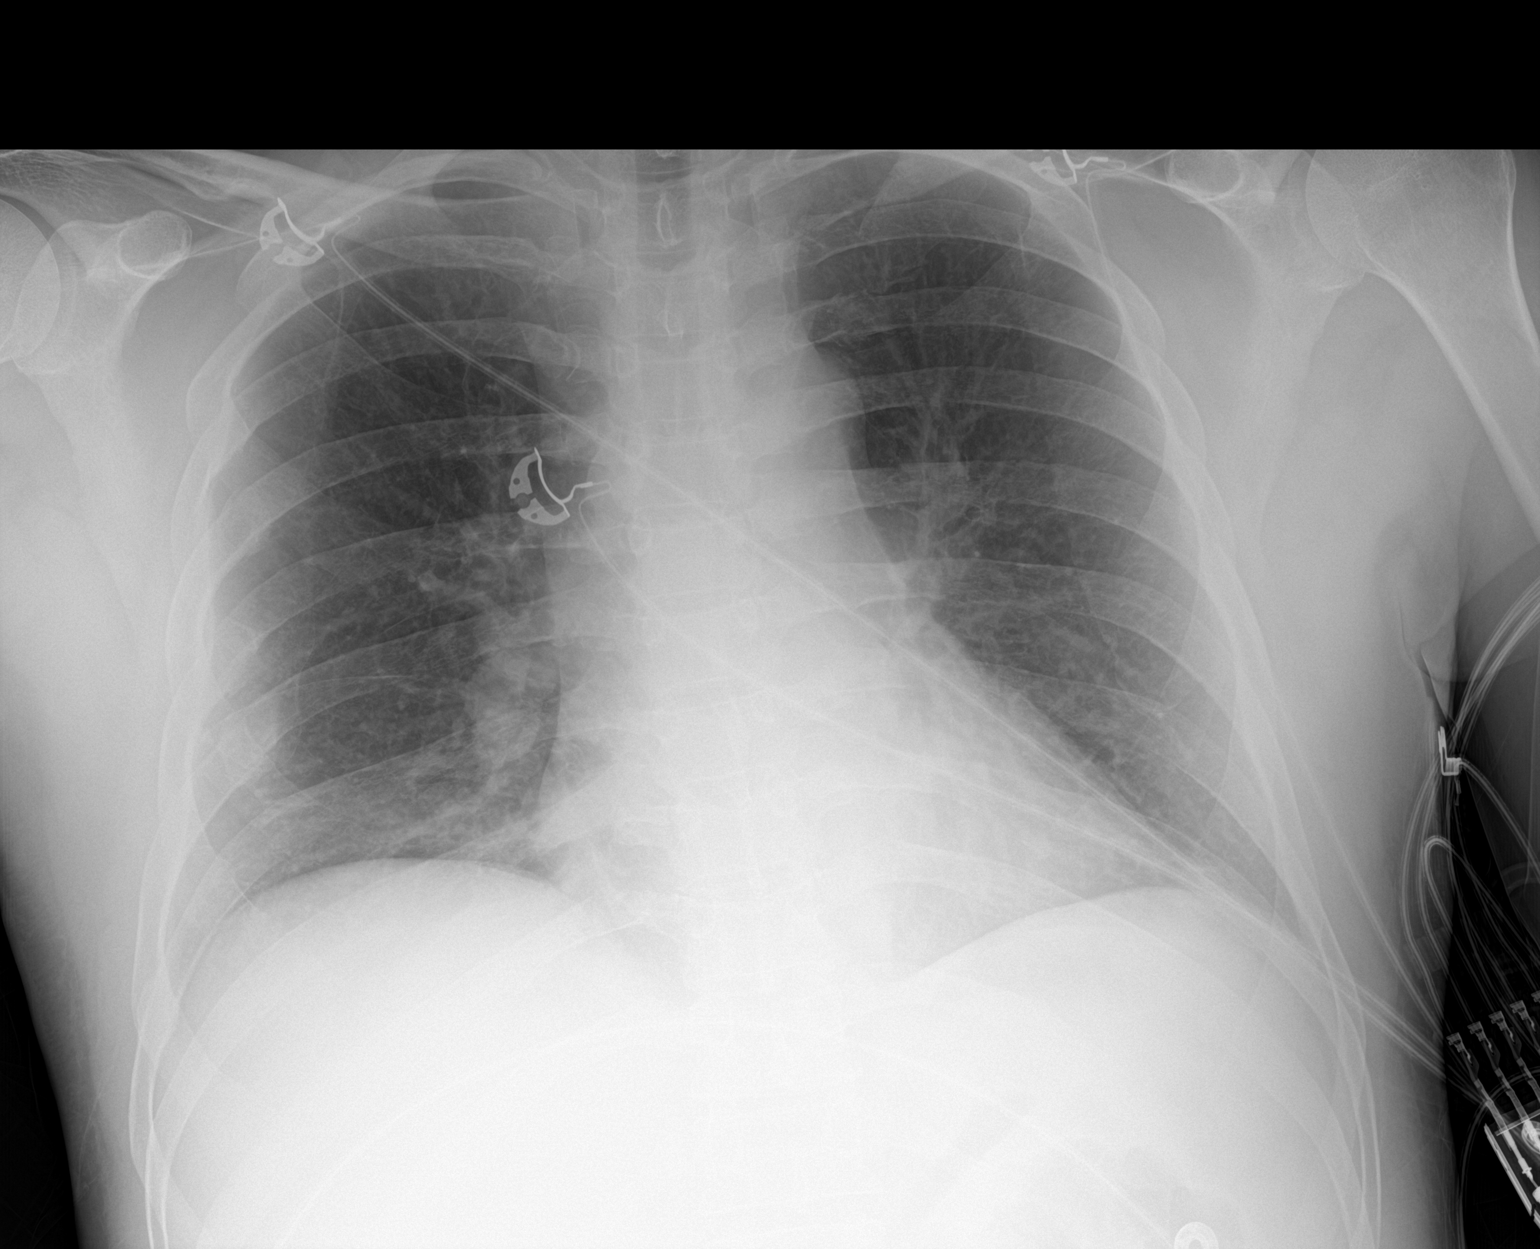

[1 of 1 positions shown; findings below may reference images not displayed]

FINDINGS: The heart size and mediastinal contours are within normal limits.
Basilar opacity on the right likely represents atelectasis. There is
no evidence of pulmonary edema, consolidation, pneumothorax, nodule
or pleural fluid. The visualized skeletal structures are
unremarkable.
IMPRESSION: Right basilar atelectasis.  No active disease.
